# Patient Record
Sex: Female | Born: 1965 | Race: White | Hispanic: No | Marital: Married | Smoking: Former smoker
Health system: Southern US, Academic
[De-identification: ages and names within clinical notes are randomized; demographics above are authoritative.]

## PROBLEM LIST (undated history)

## (undated) DIAGNOSIS — C801 Malignant (primary) neoplasm, unspecified: Secondary | ICD-10-CM

## (undated) DIAGNOSIS — E079 Disorder of thyroid, unspecified: Secondary | ICD-10-CM

## (undated) DIAGNOSIS — M129 Arthropathy, unspecified: Secondary | ICD-10-CM

## (undated) DIAGNOSIS — I10 Essential (primary) hypertension: Secondary | ICD-10-CM

## (undated) DIAGNOSIS — I87309 Chronic venous hypertension (idiopathic) without complications of unspecified lower extremity: Secondary | ICD-10-CM

## (undated) DIAGNOSIS — I341 Nonrheumatic mitral (valve) prolapse: Secondary | ICD-10-CM

## (undated) DIAGNOSIS — E162 Hypoglycemia, unspecified: Secondary | ICD-10-CM

## (undated) DIAGNOSIS — I472 Ventricular tachycardia: Secondary | ICD-10-CM

## (undated) DIAGNOSIS — M810 Age-related osteoporosis without current pathological fracture: Secondary | ICD-10-CM

## (undated) DIAGNOSIS — I428 Other cardiomyopathies: Secondary | ICD-10-CM

## (undated) DIAGNOSIS — Q210 Ventricular septal defect: Secondary | ICD-10-CM

## (undated) DIAGNOSIS — Z87442 Personal history of urinary calculi: Secondary | ICD-10-CM

## (undated) DIAGNOSIS — I493 Ventricular premature depolarization: Secondary | ICD-10-CM

## (undated) DIAGNOSIS — I471 Supraventricular tachycardia, unspecified: Secondary | ICD-10-CM

## (undated) DIAGNOSIS — I82409 Acute embolism and thrombosis of unspecified deep veins of unspecified lower extremity: Secondary | ICD-10-CM

## (undated) DIAGNOSIS — I4719 Other supraventricular tachycardia: Secondary | ICD-10-CM

## (undated) DIAGNOSIS — R29898 Other symptoms and signs involving the musculoskeletal system: Secondary | ICD-10-CM

## (undated) DIAGNOSIS — R000 Tachycardia, unspecified: Secondary | ICD-10-CM

## (undated) DIAGNOSIS — N83209 Unspecified ovarian cyst, unspecified side: Secondary | ICD-10-CM

## (undated) HISTORY — DX: Nonrheumatic mitral (valve) prolapse: I34.1

## (undated) HISTORY — DX: Other symptoms and signs involving the musculoskeletal system: R29.898

## (undated) HISTORY — DX: Other supraventricular tachycardia: I47.19

## (undated) HISTORY — DX: Other cardiomyopathies: I42.8

## (undated) HISTORY — DX: Chronic venous hypertension (idiopathic) without complications of unspecified lower extremity: I87.309

## (undated) HISTORY — PX: HAMMER TOE SURGERY: SHX385

## (undated) HISTORY — PX: COLONOSCOPY: SHX174

## (undated) HISTORY — DX: Unspecified ovarian cyst, unspecified side: N83.209

## (undated) HISTORY — PX: ENDOVENOUS ABLATION SAPHENOUS VEIN W/ LASER: SUR449

## (undated) HISTORY — DX: Supraventricular tachycardia: I47.1

## (undated) HISTORY — DX: Supraventricular tachycardia, unspecified: I47.10

## (undated) HISTORY — DX: Ventricular premature depolarization: I49.3

## (undated) HISTORY — DX: Ventricular septal defect: Q21.0

## (undated) HISTORY — PX: OVARIAN CYST REMOVAL: SHX89

## (undated) HISTORY — DX: Ventricular tachycardia: I47.2

## (undated) HISTORY — PX: BUNIONECTOMY: SHX129

## (undated) HISTORY — PX: WRIST FRACTURE SURGERY: SHX121

## (undated) HISTORY — DX: Tachycardia, unspecified: R00.0

## (undated) HISTORY — PX: HX BREAST BIOPSY: SHX20

## (undated) HISTORY — DX: Malignant (primary) neoplasm, unspecified (CMS HCC): C80.1

---

## 1990-07-11 ENCOUNTER — Ambulatory Visit (HOSPITAL_COMMUNITY): Payer: Self-pay

## 1998-07-31 ENCOUNTER — Other Ambulatory Visit: Admission: RE | Admit: 1998-07-31 | Discharge: 1998-07-31 | Payer: Self-pay | Admitting: Obstetrics and Gynecology

## 1999-09-10 ENCOUNTER — Other Ambulatory Visit: Admission: RE | Admit: 1999-09-10 | Discharge: 1999-09-10 | Payer: Self-pay | Admitting: Obstetrics and Gynecology

## 2000-10-06 ENCOUNTER — Other Ambulatory Visit: Admission: RE | Admit: 2000-10-06 | Discharge: 2000-10-06 | Payer: Self-pay | Admitting: Obstetrics and Gynecology

## 2001-11-15 ENCOUNTER — Other Ambulatory Visit: Admission: RE | Admit: 2001-11-15 | Discharge: 2001-11-15 | Payer: Self-pay | Admitting: Obstetrics and Gynecology

## 2001-12-04 ENCOUNTER — Ambulatory Visit (HOSPITAL_COMMUNITY): Admission: RE | Admit: 2001-12-04 | Discharge: 2001-12-04 | Payer: Self-pay | Admitting: Obstetrics and Gynecology

## 2002-11-19 ENCOUNTER — Other Ambulatory Visit: Admission: RE | Admit: 2002-11-19 | Discharge: 2002-11-19 | Payer: Self-pay | Admitting: Obstetrics and Gynecology

## 2003-11-27 ENCOUNTER — Other Ambulatory Visit: Admission: RE | Admit: 2003-11-27 | Discharge: 2003-11-27 | Payer: Self-pay | Admitting: Obstetrics and Gynecology

## 2004-11-27 ENCOUNTER — Other Ambulatory Visit: Admission: RE | Admit: 2004-11-27 | Discharge: 2004-11-27 | Payer: Self-pay | Admitting: Obstetrics and Gynecology

## 2006-11-03 ENCOUNTER — Ambulatory Visit (HOSPITAL_COMMUNITY): Admission: RE | Admit: 2006-11-03 | Discharge: 2006-11-03 | Payer: Self-pay | Admitting: Surgery

## 2011-01-13 ENCOUNTER — Other Ambulatory Visit: Payer: Self-pay | Admitting: Obstetrics and Gynecology

## 2012-03-21 ENCOUNTER — Other Ambulatory Visit: Payer: Self-pay | Admitting: Obstetrics and Gynecology

## 2013-06-24 ENCOUNTER — Emergency Department (HOSPITAL_COMMUNITY)
Admission: EM | Admit: 2013-06-24 | Discharge: 2013-06-24 | Disposition: A | Discharge status: Home / Self Care | Payer: BC Managed Care – PPO | Attending: Emergency Medicine | Admitting: Emergency Medicine

## 2013-06-24 ENCOUNTER — Encounter (HOSPITAL_COMMUNITY): Payer: Self-pay | Admitting: Emergency Medicine

## 2013-06-24 ENCOUNTER — Emergency Department (HOSPITAL_COMMUNITY): Payer: BC Managed Care – PPO

## 2013-06-24 DIAGNOSIS — M549 Dorsalgia, unspecified: Secondary | ICD-10-CM | POA: Insufficient documentation

## 2013-06-24 DIAGNOSIS — IMO0001 Reserved for inherently not codable concepts without codable children: Secondary | ICD-10-CM | POA: Insufficient documentation

## 2013-06-24 DIAGNOSIS — M7918 Myalgia, other site: Secondary | ICD-10-CM

## 2013-06-24 DIAGNOSIS — R5381 Other malaise: Secondary | ICD-10-CM | POA: Insufficient documentation

## 2013-06-24 DIAGNOSIS — R05 Cough: Secondary | ICD-10-CM

## 2013-06-24 DIAGNOSIS — Z888 Allergy status to other drugs, medicaments and biological substances status: Secondary | ICD-10-CM | POA: Insufficient documentation

## 2013-06-24 DIAGNOSIS — J069 Acute upper respiratory infection, unspecified: Secondary | ICD-10-CM | POA: Insufficient documentation

## 2013-06-24 HISTORY — DX: Hypoglycemia, unspecified: E16.2

## 2013-06-24 MED ORDER — NAPROXEN 250 MG PO TABS: 250.0000 mg | ORAL_TABLET | Freq: Two times a day (BID) | ORAL | Status: DC

## 2013-06-24 MED ORDER — IBUPROFEN 400 MG PO TABS
400.0000 mg | ORAL_TABLET | Freq: Once | ORAL | Status: AC
  Administered 2013-06-24: 400 mg via ORAL
  Filled 2013-06-24: qty 1

## 2013-06-24 MED ORDER — ALBUTEROL SULFATE HFA 108 (90 BASE) MCG/ACT IN AERS
2.0000 | INHALATION_SPRAY | RESPIRATORY_TRACT | Status: AC
  Administered 2013-06-24: 2 via RESPIRATORY_TRACT
  Filled 2013-06-24: qty 6.7

## 2013-06-24 MED ORDER — BENZONATATE 100 MG PO CAPS: 100.0000 mg | ORAL_CAPSULE | Freq: Three times a day (TID) | ORAL | Status: DC | PRN

## 2013-06-24 MED ORDER — HYDROCODONE-ACETAMINOPHEN 5-325 MG PO TABS
1.0000 | ORAL_TABLET | Freq: Once | ORAL | Status: AC
  Administered 2013-06-24: 1 via ORAL
  Filled 2013-06-24: qty 1

## 2013-06-24 MED ORDER — HYDROCODONE-ACETAMINOPHEN 5-325 MG PO TABS: ORAL_TABLET | ORAL | Status: DC

## 2013-06-24 NOTE — ED Notes (Signed)
Family at bedside. 

## 2013-06-24 NOTE — ED Notes (Signed)
Patient advised her husband has been diagnosed with pneumonia and she started having URI symptoms two weeks ago.  The patient said her PCP prescribed antibiotics for 10 days and she has gotten worse instead of better.  Today, she presents with a productive cough, pain between her shoulder blades, and weak.

## 2013-06-24 NOTE — ED Notes (Signed)
Pt c/o URI sx x 2 weeks with cough and congestion; pt sts increased pain in back with cough

## 2013-06-24 NOTE — ED Provider Notes (Signed)
CSN: [id number]     Arrival date & time 06/24/13  1047 History   First MD Initiated Contact with Patient 06/24/13 1117     Chief Complaint  Patient presents with  . URI  . Cough    HPI Pt was seen at 1130.   Per pt, c/o gradual onset and persistence of constant runny/stuffy nose, sinus congestion, and cough for the past 10 days. States she initially had a sore throat, since resolved. Pt states she was evaluated by her PMD for same, rx abx and prednisone but feels she is "not getting any better." States she feels generally fatigued and has mid-back "aching" pain. States her back pain began after "coughing so much." Pain worsens with palpation of the area, coughing, and body position changes. Denies fevers, no rash, no CP/SOB, no N/V/D, no abd pain.     Past Medical History  Diagnosis Date  . Hypoglycemia    History reviewed. No pertinent past surgical history.  History  Substance Use Topics  . Smoking status: Never Smoker   . Smokeless tobacco: Not on file  . Alcohol Use: Yes     Comment: occ    Review of Systems ROS: Statement: All systems negative except as marked or noted in the HPI; Constitutional: Negative for fever and chills. +generalized body aches/fatigue. ; ; Eyes: Negative for eye pain, redness and discharge. ; ; ENMT: Negative for ear pain, hoarseness, sore throat. +nasal congestion, rhinorrhea, sinus pressure. ; ; Cardiovascular: Negative for chest pain, palpitations, diaphoresis, dyspnea and peripheral edema. ; ; Respiratory: +cough. Negative for wheezing and stridor. ; ; Gastrointestinal: Negative for nausea, vomiting, diarrhea, abdominal pain, blood in stool, hematemesis, jaundice and rectal bleeding. . ; ; Genitourinary: Negative for dysuria, flank pain and hematuria. ; ; Musculoskeletal: +back pain. Negative for neck pain. Negative for swelling and trauma.; ; Skin: Negative for pruritus, rash, abrasions, blisters, bruising and skin lesion.; ; Neuro: Negative for  headache, lightheadedness and neck stiffness. Negative for weakness, altered level of consciousness , altered mental status, extremity weakness, paresthesias, involuntary movement, seizure and syncope.      Allergies  Hydrocortisone  Home Medications  No current outpatient prescriptions on file. BP 129/58  Pulse 79  Temp(Src) 98.1 F (36.7 C) (Oral)  Resp 17  Wt 141 lb (63.957 kg)  SpO2 100% Physical Exam 1135: Physical examination:  Nursing notes reviewed; Vital signs and O2 SAT reviewed;  Constitutional: Well developed, Well nourished, Well hydrated, In no acute distress; Head:  Normocephalic, atraumatic; Eyes: EOMI, PERRL, No scleral icterus; ENMT: TM's clear bilat. +edemetous nasal turbinates bilat with clear rhinorrhea. Mouth and pharynx without lesions. No tonsillar exudates. No intra-oral edema. No submandibular or sublingual edema. No hoarse voice, no drooling, no stridor. No pain with manipulation of larynx.  Mouth and pharynx normal, Mucous membranes moist; Neck: Supple, Full range of motion, No lymphadenopathy; Cardiovascular: Regular rate and rhythm, No gallop; Respiratory: Breath sounds diminished & equal bilaterally, No wheezes.  Speaking full sentences with ease, Normal respiratory effort/excursion; Chest: Nontender, Movement normal; Abdomen: Soft, Nontender, Nondistended, Normal bowel sounds; Genitourinary: No CVA tenderness; Spine:  No midline CS, TS, LS tenderness. +TTP bilat hypertonic thoracic paraspinal muscles.; Extremities: Pulses normal, No tenderness, No edema, No calf edema or asymmetry.; Neuro: AA&Ox3, Major CN grossly intact.  Speech clear. No gross focal motor or sensory deficits in extremities.; Skin: Color normal, Warm, Dry.   ED Course  Procedures   EKG Interpretation   None  MDM  MDM Reviewed: previous chart, nursing note and vitals Interpretation: x-ray     Dg Chest 2 View 06/24/2013   CLINICAL DATA:  Cold and congestion symptoms for 2  weeks.  EXAM: CHEST  2 VIEW  COMPARISON:  None.  FINDINGS: The heart size and mediastinal contours are within normal limits. Both lungs are clear. The visualized skeletal structures are unremarkable.  IMPRESSION: No active cardiopulmonary disease.   Electronically Signed   By: Amie Portland M.D.   On: 06/24/2013 12:33    1300:  Feels better after meds and MDI. Pt not orthostatic, VS remain stable, resps easy. No infiltrate or mediastinal widening on CXR; doubt TAA as cause for back pain. Doubt PE with low risk Wells. Doubt ACS without chest pain, TIMI 0. Pt states she wants to go home now. Will tx symptomatically at this time. Dx and testing d/w pt and family.  Questions answered.  Verb understanding, agreeable to d/c home with outpt f/u.     Laray Anger, DO 06/27/13 1451

## 2014-04-02 ENCOUNTER — Other Ambulatory Visit: Payer: Self-pay | Admitting: Obstetrics and Gynecology

## 2014-04-03 LAB — CYTOLOGY - PAP

## 2015-06-03 ENCOUNTER — Other Ambulatory Visit: Payer: Self-pay | Admitting: Obstetrics and Gynecology

## 2015-06-04 LAB — CYTOLOGY - PAP

## 2016-06-08 ENCOUNTER — Other Ambulatory Visit: Payer: Self-pay | Admitting: Obstetrics and Gynecology

## 2016-06-09 LAB — CYTOLOGY - PAP

## 2017-09-27 ENCOUNTER — Emergency Department (HOSPITAL_COMMUNITY)
Admission: EM | Admit: 2017-09-27 | Discharge: 2017-09-27 | Disposition: A | Discharge status: Home / Self Care | Payer: BLUE CROSS/BLUE SHIELD | Attending: Emergency Medicine | Admitting: Emergency Medicine

## 2017-09-27 ENCOUNTER — Encounter (HOSPITAL_COMMUNITY): Payer: Self-pay

## 2017-09-27 DIAGNOSIS — M79605 Pain in left leg: Secondary | ICD-10-CM | POA: Insufficient documentation

## 2017-09-27 DIAGNOSIS — Z5321 Procedure and treatment not carried out due to patient leaving prior to being seen by health care provider: Secondary | ICD-10-CM | POA: Diagnosis not present

## 2017-09-27 HISTORY — DX: Disorder of thyroid, unspecified: E07.9

## 2017-09-27 HISTORY — DX: Acute embolism and thrombosis of unspecified deep veins of unspecified lower extremity: I82.409

## 2017-09-27 LAB — BASIC METABOLIC PANEL
Anion gap: 7 (ref 5–15)
BUN: 20 mg/dL (ref 6–20)
CO2: 27 mmol/L (ref 22–32)
CREATININE: 0.77 mg/dL (ref 0.44–1.00)
Calcium: 12.5 mg/dL — ABNORMAL HIGH (ref 8.9–10.3)
Chloride: 106 mmol/L (ref 101–111)
GFR calc Af Amer: >60 mL/min (ref >60)
GLUCOSE: 93 mg/dL (ref 65–99)
POTASSIUM: 4.6 mmol/L (ref 3.5–5.1)
SODIUM: 140 mmol/L (ref 135–145)

## 2017-09-27 LAB — CBC
HEMATOCRIT: 40.1 % (ref 36.0–46.0)
Hemoglobin: 12.5 g/dL (ref 12.0–15.0)
MCH: 29.7 pg (ref 26.0–34.0)
MCHC: 31.2 g/dL (ref 30.0–36.0)
MCV: 95.2 fL (ref 78.0–100.0)
Platelets: 220 10*3/uL (ref 150–400)
RBC: 4.21 MIL/uL (ref 3.87–5.11)
RDW: 13.1 % (ref 11.5–15.5)
WBC: 5.5 10*3/uL (ref 4.0–10.5)

## 2017-09-27 NOTE — ED Triage Notes (Addendum)
Pt presents with onset of pain and swelling to L leg that she noted while at grocery store.  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  Pt denies any shortness of breath. Pt denies any chest pain but reports while bringing in groceries, she was very fatigued and had palpitations.

## 2017-09-27 NOTE — ED Provider Notes (Cosign Needed)
Patient placed in Quick Look pathway, seen and evaluated   Chief Complaint: left leg pain  HPI:   Jessica Hanson is a 52 y.o. female who presents to the ED with left leg pain that started while she was in the grocery store tonight. Hx of DVT and was on blood thinners for 3 months but has been off but has been off of them for 4 months. Patient denies shortness of breath or chest pain.   ROS: M/S: left leg pain  Physical Exam:   Gen: No distress  Neuro: Awake and Alert  Skin: Warm and dry  M/S: tender with palpation of the left calf and and posterior aspect of the left knee.     Focused Exam:    Initiation of care has begun. The patient has been counseled on the process, plan, and necessity for staying for the completion/evaluation, and the remainder of the medical screening examination    Ashley Murrain, NP 09/27/17 1845

## 2018-01-06 DIAGNOSIS — Z1211 Encounter for screening for malignant neoplasm of colon: Secondary | ICD-10-CM | POA: Diagnosis not present

## 2018-01-06 DIAGNOSIS — Z86010 Personal history of colonic polyps: Secondary | ICD-10-CM | POA: Diagnosis not present

## 2018-01-24 DIAGNOSIS — J209 Acute bronchitis, unspecified: Secondary | ICD-10-CM | POA: Diagnosis not present

## 2018-03-08 DIAGNOSIS — Z01419 Encounter for gynecological examination (general) (routine) without abnormal findings: Secondary | ICD-10-CM | POA: Diagnosis not present

## 2018-03-08 DIAGNOSIS — Z6823 Body mass index (BMI) 23.0-23.9, adult: Secondary | ICD-10-CM | POA: Diagnosis not present

## 2018-03-08 DIAGNOSIS — Z124 Encounter for screening for malignant neoplasm of cervix: Secondary | ICD-10-CM | POA: Diagnosis not present

## 2018-03-08 DIAGNOSIS — Z1231 Encounter for screening mammogram for malignant neoplasm of breast: Secondary | ICD-10-CM | POA: Diagnosis not present

## 2018-05-26 DIAGNOSIS — Z23 Encounter for immunization: Secondary | ICD-10-CM | POA: Diagnosis not present

## 2018-07-10 ENCOUNTER — Inpatient Hospital Stay (HOSPITAL_COMMUNITY)
Admission: EM | Admit: 2018-07-10 | Discharge: 2018-07-10 | Disposition: A | Discharge status: Home / Self Care | Payer: 59 | Source: Other Acute Inpatient Hospital

## 2018-07-10 DIAGNOSIS — R0781 Pleurodynia: Secondary | ICD-10-CM

## 2018-08-24 DIAGNOSIS — B9789 Other viral agents as the cause of diseases classified elsewhere: Secondary | ICD-10-CM | POA: Diagnosis not present

## 2018-08-24 DIAGNOSIS — J988 Other specified respiratory disorders: Secondary | ICD-10-CM | POA: Diagnosis not present

## 2018-08-31 DIAGNOSIS — Z124 Encounter for screening for malignant neoplasm of cervix: Secondary | ICD-10-CM | POA: Diagnosis not present

## 2018-08-31 DIAGNOSIS — Z1211 Encounter for screening for malignant neoplasm of colon: Secondary | ICD-10-CM | POA: Diagnosis not present

## 2018-08-31 DIAGNOSIS — Z131 Encounter for screening for diabetes mellitus: Secondary | ICD-10-CM | POA: Diagnosis not present

## 2018-08-31 DIAGNOSIS — E785 Hyperlipidemia, unspecified: Secondary | ICD-10-CM | POA: Diagnosis not present

## 2018-08-31 DIAGNOSIS — Z1239 Encounter for other screening for malignant neoplasm of breast: Secondary | ICD-10-CM | POA: Diagnosis not present

## 2018-08-31 DIAGNOSIS — Z0000 Encounter for general adult medical examination without abnormal findings: Secondary | ICD-10-CM | POA: Diagnosis not present

## 2018-10-26 DIAGNOSIS — L578 Other skin changes due to chronic exposure to nonionizing radiation: Secondary | ICD-10-CM | POA: Diagnosis not present

## 2018-10-26 DIAGNOSIS — D225 Melanocytic nevi of trunk: Secondary | ICD-10-CM | POA: Diagnosis not present

## 2018-12-06 DIAGNOSIS — N950 Postmenopausal bleeding: Secondary | ICD-10-CM | POA: Diagnosis not present

## 2018-12-06 DIAGNOSIS — Z6823 Body mass index (BMI) 23.0-23.9, adult: Secondary | ICD-10-CM | POA: Diagnosis not present

## 2018-12-06 DIAGNOSIS — M79605 Pain in left leg: Secondary | ICD-10-CM | POA: Diagnosis not present

## 2019-03-09 DIAGNOSIS — H43811 Vitreous degeneration, right eye: Secondary | ICD-10-CM | POA: Diagnosis not present

## 2019-04-02 DIAGNOSIS — Z23 Encounter for immunization: Secondary | ICD-10-CM | POA: Diagnosis not present

## 2019-04-06 DIAGNOSIS — H43811 Vitreous degeneration, right eye: Secondary | ICD-10-CM | POA: Diagnosis not present

## 2019-04-06 DIAGNOSIS — H40013 Open angle with borderline findings, low risk, bilateral: Secondary | ICD-10-CM | POA: Diagnosis not present

## 2019-05-02 DIAGNOSIS — Z124 Encounter for screening for malignant neoplasm of cervix: Secondary | ICD-10-CM | POA: Diagnosis not present

## 2019-05-02 DIAGNOSIS — Z1231 Encounter for screening mammogram for malignant neoplasm of breast: Secondary | ICD-10-CM | POA: Diagnosis not present

## 2019-05-02 DIAGNOSIS — Z6822 Body mass index (BMI) 22.0-22.9, adult: Secondary | ICD-10-CM | POA: Diagnosis not present

## 2019-05-02 DIAGNOSIS — Z01419 Encounter for gynecological examination (general) (routine) without abnormal findings: Secondary | ICD-10-CM | POA: Diagnosis not present

## 2019-06-11 NOTE — Progress Notes (Signed)
[facility]    Cardiology Consult  Note    Date:  06/12/2019   ID:  Jessica Hanson, DOB Nov 12, 1965, MRN [id number]  PCP:  Jessica Greenhouse, MD  Cardiologist:  Jessica Him, MD   Chief Complaint  Patient presents with  . New Patient (Initial Visit)    Heart murmur, exertional fatigue and palpitations    History of Present Illness:  Jessica Hanson is a 53 y.o. female who is being seen today for the evaluation of palpitations and heart murmur at the request of Jessica Munro, MD  This is a 53yo female with a hx of DVT on Eliquis who recently saw her GYn and complained of problems with palpitations as well as exertional fatigue.  She is very active and works out daily.  She has swam for years and recently noticed that she has not been able to exercise as much due to getting very exhausted with exertion.  She notices this with swimming, walking to dog or exercises in her gym at home.  She denies any anginal CP or DOE.  She has also noticed that when she lays down at night that she can feel a pounding sensation in her chest.  This mainly occurs in bed but has also noticed it after walking she goes to rest.  She denies any Nausea, diaphoresis or SOB with the palpitations or when she exerts herself.  She has a very strong fm hx of premature CAD.  She stopped exercising about 2 months ago because she was concerned about her sx.     Past Medical History:  Diagnosis Date  . DVT (deep venous thrombosis) ([place])   . Hypoglycemia   . Ovarian cyst   . Thyroid disease   . Venous hypertension   . Weakness of lower extremity     Past Surgical History:  Procedure Laterality Date  . ENDOVENOUS ABLATION SAPHENOUS VEIN W/ LASER      Current Medications: Current Meds  Medication Sig  . Ascorbic Acid (VITAMIN C) 500 MG CHEW Chew 500 mg by mouth 2 (two) times daily.  Marland Kitchen aspirin EC 81 MG tablet Take 81 mg by mouth daily.  . Cholecalciferol (VITAMIN D3) 125 MCG (5000 UT) CAPS Take by mouth as directed.  .  COLLAGEN PO Take by mouth. 4-6 tablets  . diphenhydrAMINE (BENADRYL) 25 MG tablet Take 25 mg by mouth every 6 (six) hours as needed for allergies.  . Melatonin 5 MG TABS Take by mouth at bedtime.  . Omega-3 Fatty Acids (FISH OIL TRIPLE STRENGTH) 1400 MG CAPS Take by mouth as directed.  Marland Kitchen OVER THE COUNTER MEDICATION GOTU COLA 950mg  immune support  . OVER THE COUNTER MEDICATION Prevogen improve memory  . OVER THE COUNTER MEDICATION Vein formula support health veins  . pyridOXINE (VITAMIN B-6) 100 MG tablet Take 100 mg by mouth daily.  . Turmeric 450 MG CAPS Take by mouth as directed.  . vitamin B-12 (CYANOCOBALAMIN) 1000 MCG tablet Take 1,000 mcg by mouth daily.  Marland Kitchen zinc gluconate 50 MG tablet Take 50 mg by mouth daily.    Allergies:   Hydrocortisone, Oxycodone, and Sulfa antibiotics   Social History   Socioeconomic History  . Marital status: Married    Spouse name: Not on file  . Number of children: Not on file  . Years of education: Not on file  . Highest education level: Not on file  Occupational History  . Not on file  Tobacco Use  . Smoking status: Never Smoker  .  Smokeless tobacco: Never Used  Substance and Sexual Activity  . Alcohol use: Yes    Comment: occ  . Drug use: No  . Sexual activity: Not on file  Other Topics Concern  . Not on file  Social History Narrative  . Not on file   Social Determinants of Health   Financial Resource Strain:   . Difficulty of Paying Living Expenses: Not on file  Food Insecurity:   . Worried About Charity fundraiser in the Last Year: Not on file  . Ran Out of Food in the Last Year: Not on file  Transportation Needs:   . Lack of Transportation (Medical): Not on file  . Lack of Transportation (Non-Medical): Not on file  Physical Activity:   . Days of Exercise per Week: Not on file  . Minutes of Exercise per Session: Not on file  Stress:   . Feeling of Stress : Not on file  Social Connections:   . Frequency of Communication with  Friends and Family: Not on file  . Frequency of Social Gatherings with Friends and Family: Not on file  . Attends Religious Services: Not on file  . Active Member of Clubs or Organizations: Not on file  . Attends Archivist Meetings: Not on file  . Marital Status: Not on file     Family History:  The patient's family history includes Varicose Veins in her maternal grandmother and sister.   ROS:   Please see the history of present illness.    ROS All other systems reviewed and are negative.  No flowsheet data found.     PHYSICAL EXAM:   VS:  BP 132/84   Pulse 76   Ht 5\' 6"  (1.676 m)   Wt 141 lb 3.2 oz (64 kg)   BMI 22.79 kg/m    GEN: Well nourished, well developed, in no acute distress  HEENT: normal  Neck: no JVD, carotid bruits, or masses Cardiac: RRR; no rubs, or gallops,no edema.  Intact distal pulses bilaterally. 2/6 SM at LUSB, LLSB and apex, ? Mid systolic click Respiratory:  clear to auscultation bilaterally, normal work of breathing GI: soft, nontender, nondistended, + BS MS: no deformity or atrophy  Skin: warm and dry, no rash Neuro:  Alert and Oriented x 3, Strength and sensation are intact Psych: euthymic mood, full affect  Wt Readings from Last 3 Encounters:  06/12/19 141 lb 3.2 oz (64 kg)  09/27/17 140 lb (63.5 kg)  06/24/13 141 lb (64 kg)      Studies/Labs Reviewed:   EKG:  EKG is ordered today.  The ekg ordered today demonstrates NSR with no ST changes  Recent Labs: No results found for requested labs within last 8760 hours.   Lipid Panel No results found for: CHOL, TRIG, HDL, CHOLHDL, VLDL, LDLCALC, LDLDIRECT  Additional studies/ records that were reviewed today include:  Office notes from OB/GYN    ASSESSMENT:    1. Fatigue due to excessive exertion, initial encounter   2. Heart murmur   3. Palpitations      PLAN:  In order of problems listed above:  1. Exertional fatigue -she is an avid swimmer and exercises in her gym  at home but recently has noticed that she has much less stamina and gets very fatigued quickly after trying to exercise -she has a fm hx of premature CAD -I have recommended a coronary CTA to assess coronary anatomy  2.  Heart murmur -she has a systolic murmur at  the LUSB and LLSB that varies with respiration and could represent PR or TR -she also has a systolic murmur at the apex with ? Mid systolic click that could represent MVP with MR -I will get a 2D echo to assess  3.  Palpitations -these occur more at night but she also notices them after exertion -they occur on a daily basis -I will get a 2 week Zio Patch to rule out PAF -she has significantly cut back on her caffeine intake and chocolate    Medication Adjustments/Labs and Tests Ordered: Current medicines are reviewed at length with the patient today.  Concerns regarding medicines are outlined above.  Medication changes, Labs and Tests ordered today are listed in the Patient Instructions below.  Patient Instructions  Medication Instructions:  Your physician recommends that you continue on your current medications as directed. Please refer to the Current Medication list given to you today.  *If you need a refill on your cardiac medications before your next appointment, please call your pharmacy*  Testing/Procedures: Your physician has requested that you have an echocardiogram. Echocardiography is a painless test that uses sound waves to create images of your heart. It provides your doctor with information about the size and shape of your heart and how well your heart's chambers and valves are working. This procedure takes approximately one hour. There are no restrictions for this procedure.  Cardiac CT Angiography (CTA), is a special type of CT scan that uses a computer to produce multi-dimensional views of major blood vessels throughout the body. In CT angiography, a contrast material is injected through an IV to help visualize  the blood vessels.   Follow-Up: At [facility], you and your health needs are our priority.  As part of our continuing mission to provide you with exceptional heart care, we have created designated Provider Care Teams.  These Care Teams include your primary Cardiologist (physician) and Advanced Practice Providers (APPs -  Physician Assistants and Nurse Practitioners) who all work together to provide you with the care you need, when you need it.  Follow up with Dr. Radford Pax as needed based on test results.   Other Instructions  Your cardiac CT will be scheduled at one of the below locations:   [facility] [address] [place], [place] [address] [phone]  [place] [address] [place], [place] [address] [phone]  If scheduled at [facility], please arrive at the [facility] main entrance of [facility] 30-45 minutes prior to test start time. Proceed to the [facility] Radiology Department (first floor) to check-in and test prep.  If scheduled at [facility], please arrive 15 mins early for check-in and test prep.  Please follow these instructions carefully (unless otherwise directed):  On the Night Before the Test: . Be sure to Drink plenty of water. . Do not consume any caffeinated/decaffeinated beverages or chocolate 12 hours prior to your test. . Do not take any antihistamines 12 hours prior to your test.  On the Day of the Test: . Drink plenty of water. Do not drink any water within one hour of the test. . Do not eat any food 4 hours prior to the test. . You may take your regular medications prior to the test.  . Take metoprolol (Lopressor) two hours prior to test. . FEMALES- please wear underwire-free bra if available      After the Test: . Drink plenty  of water. . After receiving IV contrast, you may experience a mild flushed feeling. This is  normal. . On occasion, you may experience a mild rash up to 24 hours after the test. This is not dangerous. If this occurs, you can take Benadryl 25 mg and increase your fluid intake. . If you experience trouble breathing, this can be serious. If it is severe call 911 IMMEDIATELY. If it is mild, please call our office. . If you take any of these medications: Glipizide/Metformin, Avandament, Glucavance, please do not take 48 hours after completing test unless otherwise instructed.   Once we have confirmed authorization from your insurance company, we will call you to set up a date and time for your test.   For non-scheduling related questions, please contact the cardiac imaging nurse navigator should you have any questions/concerns: Marchia Bond, RN Navigator Cardiac Imaging [facility] and Vascular Services [phone] Office       Signed, Jessica Him, MD  06/12/2019 9:32 AM    [place], [place], [place]  [address] Phone: [phone]; Fax: [phone]

## 2019-06-12 ENCOUNTER — Encounter: Payer: Self-pay | Admitting: Cardiology

## 2019-06-12 ENCOUNTER — Other Ambulatory Visit: Payer: Self-pay

## 2019-06-12 ENCOUNTER — Telehealth: Payer: Self-pay | Admitting: Radiology

## 2019-06-12 ENCOUNTER — Ambulatory Visit (INDEPENDENT_AMBULATORY_CARE_PROVIDER_SITE_OTHER): Payer: BC Managed Care – PPO | Admitting: Cardiology

## 2019-06-12 VITALS — BP 132/84 | HR 76 | Ht 66.0 in | Wt 141.2 lb

## 2019-06-12 DIAGNOSIS — T733XXA Exhaustion due to excessive exertion, initial encounter: Secondary | ICD-10-CM | POA: Diagnosis not present

## 2019-06-12 DIAGNOSIS — R002 Palpitations: Secondary | ICD-10-CM | POA: Diagnosis not present

## 2019-06-12 DIAGNOSIS — I82403 Acute embolism and thrombosis of unspecified deep veins of lower extremity, bilateral: Secondary | ICD-10-CM

## 2019-06-12 DIAGNOSIS — R011 Cardiac murmur, unspecified: Secondary | ICD-10-CM

## 2019-06-12 MED ORDER — METOPROLOL TARTRATE 100 MG PO TABS: 100.0000 mg | ORAL_TABLET | Freq: Once | ORAL | 0 refills | Status: DC

## 2019-06-12 NOTE — Telephone Encounter (Signed)
Enrolled patient for a 14 day Zio monitor to be mailed to patients home.  

## 2019-06-12 NOTE — Patient Instructions (Addendum)
Medication Instructions:  Your physician recommends that you continue on your current medications as directed. Please refer to the Current Medication list given to you today.  *If you need a refill on your cardiac medications before your next appointment, please call your pharmacy*  Testing/Procedures: Your physician has requested that you have an echocardiogram. Echocardiography is a painless test that uses sound waves to create images of your heart. It provides your doctor with information about the size and shape of your heart and how well your heart's chambers and valves are working. This procedure takes approximately one hour. There are no restrictions for this procedure.  Cardiac CT Angiography (CTA), is a special type of CT scan that uses a computer to produce multi-dimensional views of major blood vessels throughout the body. In CT angiography, a contrast material is injected through an IV to help visualize the blood vessels.   Follow-Up: At [facility], you and your health needs are our priority.  As part of our continuing mission to provide you with exceptional heart care, we have created designated Provider Care Teams.  These Care Teams include your primary Cardiologist (physician) and Advanced Practice Providers (APPs -  Physician Assistants and Nurse Practitioners) who all work together to provide you with the care you need, when you need it.  Follow up with Jessica Hanson as needed based on test results.   Other Instructions  Your cardiac CT will be scheduled at one of the below locations:   [facility] [address] [place], [place] [address] [phone]  [place] [address] [place], [place] [address] [phone]  If scheduled at [facility], please arrive at the [facility] main entrance of [facility] 30-45 minutes prior to test start time. Proceed to the [facility] Radiology  Department (first floor) to check-in and test prep.  If scheduled at [facility], please arrive 15 mins early for check-in and test prep.  Please follow these instructions carefully (unless otherwise directed):  On the Night Before the Test: . Be sure to Drink plenty of water. . Do not consume any caffeinated/decaffeinated beverages or chocolate 12 hours prior to your test. . Do not take any antihistamines 12 hours prior to your test.  On the Day of the Test: . Drink plenty of water. Do not drink any water within one hour of the test. . Do not eat any food 4 hours prior to the test. . You may take your regular medications prior to the test.  . Take metoprolol (Lopressor) two hours prior to test. . FEMALES- please wear underwire-free bra if available      After the Test: . Drink plenty of water. . After receiving IV contrast, you may experience a mild flushed feeling. This is normal. . On occasion, you may experience a mild rash up to 24 hours after the test. This is not dangerous. If this occurs, you can take Benadryl 25 mg and increase your fluid intake. . If you experience trouble breathing, this can be serious. If it is severe call 911 IMMEDIATELY. If it is mild, please call our office. . If you take any of these medications: Glipizide/Metformin, Avandament, Glucavance, please do not take 48 hours after completing test unless otherwise instructed.   Once we have confirmed authorization from your insurance company, we will call you to set up a date and time for your test.   For non-scheduling related questions, please  contact the cardiac imaging nurse navigator should you have any questions/concerns: Jessica Bond, RN Navigator Cardiac Imaging [facility] V[facility] [phone] Office

## 2019-06-12 NOTE — Addendum Note (Signed)
Addended by: Antonieta Iba on: 06/12/2019 11:21 AM   Modules accepted: Orders

## 2019-06-14 ENCOUNTER — Telehealth: Payer: Self-pay | Admitting: Cardiology

## 2019-06-14 NOTE — Telephone Encounter (Signed)
Patient states per her appt with Dr. Radford Pax on 06/12/19 she was advised by the nurse working with Dr. Radford Pax that she did not need to have blood work drawn that day. As she was reading the visit summary today she noticed that lab work had been put in the same day of her visit. She states she was denied blood work because she was told that she did not need to have it done. She lives in [place] and is [place] from the office. She has echo scheduled for next Wednesday 12/23 and wants to know if the blood work needs to be done before the echo or if she can get it done the same day. If not, she would like the lab orders to be sent to the [place] office.

## 2019-06-14 NOTE — Telephone Encounter (Signed)
I spoke to the patient who called inquiring about the lab work to be drawn prior to Cardiac CTA.  She said that she saw it on her discharge summary and was awaiting a call from Dr Theodosia Blender nurse about when it needs to be drawn.    She said that she is coming in on 12/23 for Echo and would like it drawn then, if possible.  I told her that I would have Dr Theodosia Blender nurse follow up with her on 12/18.

## 2019-06-15 NOTE — Telephone Encounter (Signed)
I spoke with the patient and clarified that her lab work needs to be done within 30 days of her CT scan. She would like to have her labs drawn on 12/23 when she is in the office for her echocardiogram. I advised her that if we can get her CT scan scheduled within 30 days after 12/23 that would be fine, if not we will have to have labs drawn at a later date. I also told her that we could get her in for a CT scan sooner in [place] but she would like it done at [facility].

## 2019-06-18 ENCOUNTER — Other Ambulatory Visit: Payer: Self-pay

## 2019-06-18 DIAGNOSIS — I82403 Acute embolism and thrombosis of unspecified deep veins of lower extremity, bilateral: Secondary | ICD-10-CM

## 2019-06-20 ENCOUNTER — Telehealth: Payer: Self-pay | Admitting: Family Medicine

## 2019-06-20 ENCOUNTER — Telehealth: Payer: Self-pay | Admitting: Cardiology

## 2019-06-20 ENCOUNTER — Other Ambulatory Visit: Payer: Self-pay

## 2019-06-20 ENCOUNTER — Ambulatory Visit (HOSPITAL_COMMUNITY): Payer: BC Managed Care – PPO | Attending: Cardiology

## 2019-06-20 DIAGNOSIS — I82403 Acute embolism and thrombosis of unspecified deep veins of lower extremity, bilateral: Secondary | ICD-10-CM

## 2019-06-20 DIAGNOSIS — R002 Palpitations: Secondary | ICD-10-CM | POA: Diagnosis not present

## 2019-06-20 DIAGNOSIS — T733XXA Exhaustion due to excessive exertion, initial encounter: Secondary | ICD-10-CM | POA: Insufficient documentation

## 2019-06-20 DIAGNOSIS — R011 Cardiac murmur, unspecified: Secondary | ICD-10-CM | POA: Insufficient documentation

## 2019-06-20 MED ORDER — PERFLUTREN LIPID MICROSPHERE
1.0000 mL | INTRAVENOUS | Status: AC | PRN
  Administered 2019-06-20: 2 mL via INTRAVENOUS

## 2019-06-20 NOTE — Telephone Encounter (Signed)
New message   Patient has questions about having some blood work (labs) done. Please advise.

## 2019-06-20 NOTE — Telephone Encounter (Signed)
Called patient per workqueue, patient would prefer to schedule with a facility that is closer to her location.

## 2019-06-20 NOTE — Telephone Encounter (Signed)
Spoke with patient and clarified that the blood work that our office wants to have done is a Art gallery manager which is to be done within 30 days of her CT scan. Since the CT scan is not scheduled yet we have not scheduled the blood work. Advised her that she would be getting a call to have both scheduled.  I also clarified that Dr. Radford Pax referred her to hematology. She had received a call to set up an appointment with them to have blood drawn due to her history of DVTs. I clarified that this was different from the blood work that we would like to have done prior to her CT. Patient verbalized understanding.

## 2019-06-21 ENCOUNTER — Other Ambulatory Visit (INDEPENDENT_AMBULATORY_CARE_PROVIDER_SITE_OTHER): Payer: BC Managed Care – PPO

## 2019-06-21 DIAGNOSIS — R011 Cardiac murmur, unspecified: Secondary | ICD-10-CM

## 2019-06-21 DIAGNOSIS — T733XXA Exhaustion due to excessive exertion, initial encounter: Secondary | ICD-10-CM | POA: Diagnosis not present

## 2019-06-21 DIAGNOSIS — R002 Palpitations: Secondary | ICD-10-CM

## 2019-06-25 ENCOUNTER — Telehealth: Payer: Self-pay | Admitting: Cardiology

## 2019-06-25 NOTE — Telephone Encounter (Signed)
Patient is returning phone call regarding Echo results.

## 2019-06-26 ENCOUNTER — Other Ambulatory Visit: Payer: Self-pay

## 2019-06-26 DIAGNOSIS — I82403 Acute embolism and thrombosis of unspecified deep veins of lower extremity, bilateral: Secondary | ICD-10-CM

## 2019-06-27 ENCOUNTER — Other Ambulatory Visit: Payer: Self-pay

## 2019-06-27 ENCOUNTER — Telehealth: Payer: Self-pay

## 2019-06-27 DIAGNOSIS — R002 Palpitations: Secondary | ICD-10-CM

## 2019-06-27 DIAGNOSIS — T733XXA Exhaustion due to excessive exertion, initial encounter: Secondary | ICD-10-CM

## 2019-06-27 NOTE — Telephone Encounter (Signed)
Patient calling to have BMET drawn in [place], I have printed out orders and mailed them to her so she can have that done. Patient is also calling about hematology referral. I informed patient that we have changed the referral to be done in [place] and she will be getting a call to have that scheduled.

## 2019-06-27 NOTE — Telephone Encounter (Signed)
New message    Patient has questions about blood work for the cancer center .  And she also wants to have her BMET for the cardiac ct done in [place], please switch order

## 2019-06-29 DIAGNOSIS — C801 Malignant (primary) neoplasm, unspecified: Secondary | ICD-10-CM

## 2019-06-29 HISTORY — DX: Malignant (primary) neoplasm, unspecified: C80.1

## 2019-07-03 ENCOUNTER — Encounter: Payer: BC Managed Care – PPO | Admitting: Hematology and Oncology

## 2019-07-17 DIAGNOSIS — R002 Palpitations: Secondary | ICD-10-CM | POA: Diagnosis not present

## 2019-07-18 ENCOUNTER — Telehealth: Payer: Self-pay | Admitting: Cardiology

## 2019-07-18 DIAGNOSIS — I82403 Acute embolism and thrombosis of unspecified deep veins of lower extremity, bilateral: Secondary | ICD-10-CM

## 2019-07-18 NOTE — Telephone Encounter (Signed)
Spoke with patient about getting her lab work done in [place] prior to her CT scan. Attempted to assist patient on where she wanted to get it done so I could get it set up for her. Patient states that she will just come into our office on [facility] tomorrow to have labs drawn.   Patient is also waiting to hear about a referral to hematology. She was called in December of last year to have this set up, but states that she would rather see someone in [place]. I advised her that I would look into getting this set up for her.

## 2019-07-18 NOTE — Addendum Note (Signed)
Addended by: Antonieta Iba on: 07/18/2019 05:10 PM   Modules accepted: Orders

## 2019-07-18 NOTE — Telephone Encounter (Signed)
New Message:      Pt says she is supposed to have lab work before her procedure on 07-26-19. It was supposed to be arranged so she can have her lab work in [employer]. She says she have not heard anything., she needs this asap please.

## 2019-07-19 ENCOUNTER — Other Ambulatory Visit: Payer: Self-pay

## 2019-07-19 ENCOUNTER — Other Ambulatory Visit: Payer: BC Managed Care – PPO | Admitting: *Deleted

## 2019-07-19 DIAGNOSIS — T733XXA Exhaustion due to excessive exertion, initial encounter: Secondary | ICD-10-CM | POA: Diagnosis not present

## 2019-07-19 DIAGNOSIS — R011 Cardiac murmur, unspecified: Secondary | ICD-10-CM

## 2019-07-19 DIAGNOSIS — R002 Palpitations: Secondary | ICD-10-CM | POA: Diagnosis not present

## 2019-07-20 ENCOUNTER — Telehealth: Payer: Self-pay

## 2019-07-20 ENCOUNTER — Telehealth: Payer: Self-pay | Admitting: Cardiology

## 2019-07-20 LAB — BASIC METABOLIC PANEL
BUN/Creatinine Ratio: 27 — ABNORMAL HIGH (ref 9–23)
BUN: 21 mg/dL (ref 6–24)
CO2: 24 mmol/L (ref 20–29)
Calcium: 13.2 mg/dL (ref 8.7–10.2)
Chloride: 101 mmol/L (ref 96–106)
Creatinine, Ser: 0.78 mg/dL (ref 0.57–1.00)
GFR calc Af Amer: 99 mL/min/{1.73_m2} (ref >59)
GFR calc non Af Amer: 86 mL/min/{1.73_m2} (ref >59)
Glucose: 100 mg/dL — ABNORMAL HIGH (ref 65–99)
Potassium: 4 mmol/L (ref 3.5–5.2)
Sodium: 141 mmol/L (ref 134–144)

## 2019-07-20 NOTE — Telephone Encounter (Signed)
The patient has been notified of the result and verbalized understanding.  All questions (if any) were answered. Antonieta Iba, RN 07/20/2019 3:51 PM   Spoke with patient's PCP and made them aware of results and faxed them over.

## 2019-07-20 NOTE — Telephone Encounter (Signed)
Jessica Hanson from [facility] with critical results.

## 2019-07-20 NOTE — Telephone Encounter (Signed)
Called pts PCP, Dr. Yvette Rack office and spoke to [place] regarding the patients critical calcium level of 13.2.

## 2019-07-20 NOTE — Telephone Encounter (Signed)
Spoke to [employer] about patient's calcium.  Dr Radford Pax is aware and her nurse is addressing.

## 2019-07-20 NOTE — Telephone Encounter (Signed)
-----   Message from Sueanne Margarita, MD sent at 07/20/2019 10:15 AM EST ----- Please call patient's PCP to let them know she has a critical Calcium level at 13.2 which needs addressing ASAP today. Otherwise labs are fine

## 2019-07-25 ENCOUNTER — Telehealth (HOSPITAL_COMMUNITY): Payer: Self-pay | Admitting: Emergency Medicine

## 2019-07-25 NOTE — Telephone Encounter (Signed)
Reaching out to patient to offer assistance regarding upcoming cardiac imaging study; pt verbalizes understanding of appt date/time, parking situation and where to check in, pre-test NPO status and medications ordered, and verified current allergies; name and call back number provided for further questions should they arise Duglas Heier RN Navigator Cardiac Imaging [facility] [phone] office [phone] cell 

## 2019-07-26 ENCOUNTER — Other Ambulatory Visit: Payer: Self-pay

## 2019-07-26 ENCOUNTER — Ambulatory Visit (HOSPITAL_COMMUNITY)
Admission: RE | Admit: 2019-07-26 | Discharge: 2019-07-26 | Disposition: A | Discharge status: Home / Self Care | Payer: BC Managed Care – PPO | Source: Ambulatory Visit | Attending: Cardiology | Admitting: Cardiology

## 2019-07-26 DIAGNOSIS — Z006 Encounter for examination for normal comparison and control in clinical research program: Secondary | ICD-10-CM

## 2019-07-26 DIAGNOSIS — R011 Cardiac murmur, unspecified: Secondary | ICD-10-CM

## 2019-07-26 DIAGNOSIS — T733XXA Exhaustion due to excessive exertion, initial encounter: Secondary | ICD-10-CM

## 2019-07-26 MED ORDER — NITROGLYCERIN 0.4 MG SL SUBL: 0.8000 mg | SUBLINGUAL_TABLET | Freq: Once | SUBLINGUAL | Status: AC

## 2019-07-26 MED ORDER — NITROGLYCERIN 0.4 MG SL SUBL
SUBLINGUAL_TABLET | SUBLINGUAL | Status: AC
  Administered 2019-07-26: 15:00:00 0.8 mg via SUBLINGUAL
  Filled 2019-07-26: qty 2

## 2019-07-26 MED ORDER — IOHEXOL 350 MG/ML SOLN
100.0000 mL | Freq: Once | INTRAVENOUS | Status: AC | PRN
  Administered 2019-07-26: 100 mL via INTRAVENOUS

## 2019-07-26 NOTE — Research (Signed)
Cadfem Informed Consent    Patient Name: Jessica Hanson   Subject met inclusion and exclusion criteria.  The informed consent form, study requirements and expectations were reviewed with the subject and questions and concerns were addressed prior to the signing of the consent form.  The subject verbalized understanding of the trail requirements.  The subject agreed to participate in the CADFEM trial and signed the informed consent.  The informed consent was obtained prior to performance of any protocol-specific procedures for the subject.  A copy of the signed informed consent was given to the subject and a copy was placed in the subject's medical record.   Neva Seat

## 2019-07-27 ENCOUNTER — Telehealth: Payer: Self-pay

## 2019-07-27 MED ORDER — METOPROLOL SUCCINATE ER 25 MG PO TB24: 25.0000 mg | ORAL_TABLET | Freq: Every day | ORAL | 3 refills | Status: DC

## 2019-07-27 NOTE — Telephone Encounter (Signed)
-----   Message from Sueanne Margarita, MD sent at 07/26/2019  6:35 PM EST ----- Normal coronary CTA

## 2019-07-27 NOTE — Telephone Encounter (Signed)
The patient has been notified of the result and verbalized understanding.  All questions (if any) were answered.  Patient asked about results from heart monitor.  Per Dr. Radford Pax: "Please let patient know that her heart monitor showed frequent extra heart beats from the top and bottom of her heart. Please start Toprol XL 25mg  daily (hold this does on the day of the Coronary CTA as she will have to take a higher dose of metoprolol previously prescribed). Please followup with Extender in 2 weeks in office to see how she is doing"  Patient was notified of results. Rx sent in for Toprol. Appointment made with Melina Copa, PA-C for 02/15.

## 2019-07-30 DIAGNOSIS — Z86711 Personal history of pulmonary embolism: Secondary | ICD-10-CM | POA: Diagnosis not present

## 2019-07-30 DIAGNOSIS — Z7901 Long term (current) use of anticoagulants: Secondary | ICD-10-CM | POA: Diagnosis not present

## 2019-07-30 DIAGNOSIS — I82409 Acute embolism and thrombosis of unspecified deep veins of unspecified lower extremity: Secondary | ICD-10-CM | POA: Diagnosis not present

## 2019-08-10 ENCOUNTER — Encounter: Payer: Self-pay | Admitting: Physician Assistant

## 2019-08-10 NOTE — Progress Notes (Signed)
Cardiology Office Note    Date:  08/13/2019   ID:  Jessica Hanson, DOB 1965-07-02, MRN [id number]  PCP:  Algis Greenhouse, MD  Cardiologist:  Fransico Him, MD  Electrophysiologist:  None   Chief Complaint: f/u cardiac testing, palpitations and fatigue  History of Present Illness:   Jessica Hanson is a 54 y.o. female with history of post-op DVT in 2018 (treated with 3 months of Eliquis), hypoglycemia, venous insufficiency, and recently diagnosed cardiomyopathy who presents for follow-up of testing. She recently saw Dr. Radford Hanson in 05/2019 complaining of palpitaitons and exertional fatigue/dyspnea. 2D echocardiogram 06/20/19 showed EF 40-45% with segmental wall motion abnormalities most prominent in the apical anteroseptal and anterior walls, more mild hypokinesis of these segments toward the base, mildly dilated LV, normal RV, mild MVP, trivial MR, turbulent flow at the border of the tricuspid annulus and RCC - differential includes small perimembranous VSD vs. turbulent right coronary cusp flow, not seen on other planes, only in parasternal short images. Event monitor was obtained showing average HR 72bpm ranging from 41 at 5:12am (SB) to 152bpm, NSR with frequent PVCs, ventricular ventricular couplets and bigeminal PVCs with PVC load 4.8%, WCT possible nonsustained ventricular tachycardia vs. nonsustained atrial tachycardia with aberration, SVT and nonsustained atrial tachycardia. She was started on Toprol. Coronary CTA 07/26/19 showed no CAD, no significant abnormalities. Last labs personally reviewed 07/19/19 showed K 4.0, Cr 0.78, calcium 13.2 (appears elevated in 2019 as well), 2019 Hgb 12.5.  She returns for follow-up today doing well. She feels better on the Toprol and has only had rare breakthrough palpitations. She has been able to exercise regularly in her home gym without any significant dyspnea, angina or palpitations. No history of syncope. Her mother has a  history of atrial fib, CAD and heart failure and is also followed by Dr. Radford Hanson. She stopped her collagen/biotin supplements and vitamin D over 2 weeks ago following the report of her elevated calcium level. She is in the process of seeking out a new PCP as she did not find they were a good match.   Past Medical History:  Diagnosis Date   DVT (deep venous thrombosis) (HCC)    Hypoglycemia    NICM (nonischemic cardiomyopathy) (HCC)    Ovarian cyst    PAT (paroxysmal atrial tachycardia) (HCC)    PVC's (premature ventricular contractions)    SVT (supraventricular tachycardia) (HCC)    Venous hypertension    Weakness of lower extremity    Wide-complex tachycardia ([place])    a. event monitor 06/2019 - possible NSVT vs AT with aberration    Past Surgical History:  Procedure Laterality Date   ENDOVENOUS ABLATION SAPHENOUS VEIN W/ LASER      Current Medications: Current Meds  Medication Sig   Ascorbic Acid (VITAMIN C) 500 MG CHEW Chew 500 mg by mouth 2 (two) times daily.   aspirin EC 81 MG tablet Take 81 mg by mouth daily.   diphenhydrAMINE (BENADRYL) 25 MG tablet Take 25 mg by mouth every 6 (six) hours as needed for allergies.   Melatonin 5 MG TABS Take by mouth at bedtime.   metoprolol succinate (TOPROL XL) 25 MG 24 hr tablet Take 1 tablet (25 mg total) by mouth daily.   Omega-3 Fatty Acids (FISH OIL TRIPLE STRENGTH) 1400 MG CAPS Take by mouth as directed.   OVER THE COUNTER MEDICATION Prevogen improve memory   pyridOXINE (VITAMIN B-6) 100 MG tablet Take 100 mg by mouth daily.   Turmeric  450 MG CAPS Take by mouth as directed.   vitamin B-12 (CYANOCOBALAMIN) 1000 MCG tablet Take 1,000 mcg by mouth daily.   zinc gluconate 50 MG tablet Take 50 mg by mouth daily.   [DISCONTINUED] Cholecalciferol (VITAMIN D3) 125 MCG (5000 UT) CAPS Take by mouth as directed.   [DISCONTINUED] COLLAGEN PO Take by mouth. 4-6 tablets    Allergies:   Hydrocortisone, Oxycodone, and Sulfa  antibiotics   Social History   Socioeconomic History   Marital status: Married    Spouse name: Not on file   Number of children: Not on file   Years of education: Not on file   Highest education level: Not on file  Occupational History   Not on file  Tobacco Use   Smoking status: Never Smoker   Smokeless tobacco: Never Used  Substance and Sexual Activity   Alcohol use: Yes    Comment: occ   Drug use: No   Sexual activity: Not on file  Other Topics Concern   Not on file  Social History Narrative   Not on file   Social Determinants of Health   Financial Resource Strain:    Difficulty of Paying Living Expenses: Not on file  Food Insecurity:    Worried About Saline in the Last Year: Not on file   Ran Out of Food in the Last Year: Not on file  Transportation Needs:    Lack of Transportation (Medical): Not on file   Lack of Transportation (Non-Medical): Not on file  Physical Activity:    Days of Exercise per Week: Not on file   Minutes of Exercise per Session: Not on file  Stress:    Feeling of Stress : Not on file  Social Connections:    Frequency of Communication with Friends and Family: Not on file   Frequency of Social Gatherings with Friends and Family: Not on file   Attends Religious Services: Not on file   Active Member of Clubs or Organizations: Not on file   Attends Archivist Meetings: Not on file   Marital Status: Not on file     Family History:  The patient's family history includes Varicose Veins in her maternal grandmother and sister.  ROS:   Please see the history of present illness.  All other systems are reviewed and otherwise negative.    EKGs/Labs/Other Studies Reviewed:    Studies reviewed are outlined and summarized above. Reports included below if pertinent.  2D echo 05/2019 1. Left ventricular ejection fraction, by visual estimation, is 40 to  45%. The left ventricle has normal function.  There is no left ventricular  hypertrophy.  2. Multiple segmental abnormalities exist. See findings.  3. Mildly dilated left ventricular internal cavity size.  4. The left ventricle demonstrates regional wall motion abnormalities.  5. Global right ventricle has normal systolic function.The right  ventricular size is normal. No increase in right ventricular wall  thickness.  6. Left atrial size was normal.  7. Right atrial size was normal.  8. Mild mitral valve prolapse.  9. The mitral valve is normal in structure. Trivial mitral valve  regurgitation. No evidence of mitral stenosis.  10. The tricuspid valve is normal in structure.  11. The aortic valve is tricuspid. Aortic valve regurgitation is not  visualized. No evidence of aortic valve sclerosis or stenosis.  12. The pulmonic valve was normal in structure. Pulmonic valve  regurgitation is not visualized.  13. The inferior vena cava is normal in  size with greater than 50%  respiratory variability, suggesting right atrial pressure of 3 mmHg.  14. MIld bileaflet mitral valve prolapse.  15. On parasternal short images (img 31, 32) there is turbulent flow seen  at the border of the tricuspid annulus and RCC. Differential includes  small perimembranous VSD vs. turbulent right coronary cusp flow. Not seen  in other imaging planes.  16. There appears to be focal wall motion abnormalities, most prominent in  the apical anteroseptal and anterior walls. More mild hypokinesis of these  segments toward the base.   Coronary CTA 07/26/19 EXAM: Cardiac/Coronary  CT  TECHNIQUE: The patient was scanned on a [employer].  FINDINGS: A 120 kV prospective scan was triggered in the descending thoracic aorta at 111 HU's. Axial non-contrast 3 mm slices were carried out through the heart. The data set was analyzed on a dedicated work station and scored using the [place]. Gantry rotation speed was 250 msecs and collimation  was .6 mm. No beta blockade and 0.8 mg of sl NTG was given. The 3D data set was reconstructed in 5% intervals of the 67-82 % of the R-R cycle. Diastolic phases were analyzed on a dedicated work station using MPR, MIP and VRT modes. The patient received 80 cc of contrast.  Aorta:  Normal size.  No calcifications.  No dissection.  Aortic Valve:  Trileaflet.  No calcifications.  Coronary Arteries:  Normal coronary origin.  Right dominance.  RCA is a large dominant artery that gives rise to PDA and PLVB. There is no plaque.  Left main is a large artery that gives rise to LAD, Ramus and LCX arteries. There is no plaque.  The Ramus is a very small vessel.  There is no plaque.  LAD is a large vessel that gives rise to a moderate sized diagonal. There is no plaque.  LCX is a very small non-dominant artery that gives rise to one large OM1 branch. There is no plaque.  Other findings:  Normal pulmonary vein drainage into the left atrium.  Normal let atrial appendage without a thrombus.  Normal size of the pulmonary artery.  IMPRESSION: 1. Coronary calcium score of 0. This was 0 percentile for age and sex matched control.  2. Normal coronary origin with right dominance.  3. No evidence of CAD.  4. Consider non-atherosclerotic causes of chest pain.  Fransico Him  Event Monitor Report 06/2019  Sinus bradycardia, normal sinus rhythm and sinus tachcyardia. The average heart rate was 72bpm and ranged from 41 to 152bpm.  Frequent PVCs, ventricular couplets and bigeminal PVCs with PVC load 4.8%.  Wide complex tachycardia possible nonsustained ventricular tachcyardia vs. nonsustained atrial tachycardia with aberration.  SVT and nonsustained atrial tachycardia.      EKG:  EKG is not ordered today  Recent Labs: 07/19/2019: BUN 21; Creatinine, Ser 0.78; Potassium 4.0; Sodium 141  Recent Lipid Panel No results found for: CHOL, TRIG, HDL, CHOLHDL, VLDL, LDLCALC,  LDLDIRECT  PHYSICAL EXAM:    VS:  BP 118/68    Pulse 68    Ht 5\' 6"  (1.676 m)    Wt 148 lb (67.1 kg)    SpO2 98%    BMI 23.89 kg/m   BMI: Body mass index is 23.89 kg/m.  GEN: Well nourished, well developed WF, in no acute distress HEENT: normocephalic, atraumatic Neck: no JVD, carotid bruits, or masses Cardiac: RRR; no murmurs, rubs, or gallops, no edema  Respiratory:  clear to auscultation bilaterally, normal work of breathing  GI: soft, nontender, nondistended, + BS MS: no deformity or atrophy Skin: warm and dry, no rash Neuro:  Alert and Oriented x 3, Strength and sensation are intact, follows commands Psych: euthymic mood, full affect  Wt Readings from Last 3 Encounters:  08/13/19 148 lb (67.1 kg)  06/12/19 141 lb 3.2 oz (64 kg)  09/27/17 140 lb (63.5 kg)  Weight variation likely due to knee high boots and additional clothing  ASSESSMENT & PLAN:   1. Palpitations with findings of PVCs, SVT/PAT, and NSVT vs AT with aberration - improved with metoprolol. She has not had any basic bloodwork aside from recent BMET. Will recheck today with ionized calcium level as below, and also obtain baseline TSH, CBC and Mg for review. Continue metoprolol. See below regarding w/u of NICM. 2. Nonischemic cardiomyopathy - discussed with Dr. Radford Hanson. Etiology not entirely clear. No viral precipitant per d/w patient. She observes a very healthy lifestyle otherwise. Coronary CTA was negative for CAD. We will undertake a cMRI to further evaluate for any infiltrative disease. The patient denies any claustrophobia. Await definitive EF assessment by MRI before determining whether any further med titration is necessary at this time. We discussed observation of sodium and fluid limitation. She currently drinks over 100oz by her estimate to keep up with her active lifestyle. Given the degree of exertion she puts in with exercise and the fact that she has not had any clinical heart failure I think this is a  reasonable maximum for now. We did discuss signs of fluid overload to include symptoms and daily weights. Weight variation likely due to knee high boots and additional clothing. She reports this is stable at home. 3. Question of small VSD - I discussed this with Dr. Radford Hanson who confirms this was not seen on coronary CTA. The patient reports being told in the past she had a murmur but then her PCP did not hear one. I see this was documented in Dr. Theodosia Blender exam last visit but I do not appreciate any significant murmurs today. I wonder if this was perhaps related to variable turbulent coronary cusp flow as outlined in echo. The cMRI will also include further information on valvular status. 4. Hypercalcemia - the patient believes this is due to interference from her biotin supplement. She has been off this for over 2 weeks. We will recheck BMET today with ionized calcium level.  Disposition: F/u with Dr. Radford Hanson in 3 months, sooner if needed based on cardiac MRI. We also gave her information on how to sign up for MyChart as she inquired about having easier access to her reports.   Medication Adjustments/Labs and Tests Ordered: Current medicines are reviewed at length with the patient today.  Concerns regarding medicines are outlined above. Medication changes, Labs and Tests ordered today are summarized above and listed in the Patient Instructions accessible in Encounters.   Signed, Charlie Pitter, PA-C  08/13/2019 11:23 AM    [address], [place], [place]  [address] Phone: [phone]; Fax: [phone]

## 2019-08-13 ENCOUNTER — Ambulatory Visit (INDEPENDENT_AMBULATORY_CARE_PROVIDER_SITE_OTHER): Payer: BC Managed Care – PPO | Admitting: Physician Assistant

## 2019-08-13 ENCOUNTER — Other Ambulatory Visit: Payer: Self-pay

## 2019-08-13 ENCOUNTER — Encounter: Payer: Self-pay | Admitting: Physician Assistant

## 2019-08-13 VITALS — BP 118/68 | HR 68 | Ht 66.0 in | Wt 148.0 lb

## 2019-08-13 DIAGNOSIS — R000 Tachycardia, unspecified: Secondary | ICD-10-CM

## 2019-08-13 DIAGNOSIS — I471 Supraventricular tachycardia: Secondary | ICD-10-CM

## 2019-08-13 DIAGNOSIS — I472 Ventricular tachycardia: Secondary | ICD-10-CM | POA: Diagnosis not present

## 2019-08-13 DIAGNOSIS — R931 Abnormal findings on diagnostic imaging of heart and coronary circulation: Secondary | ICD-10-CM

## 2019-08-13 DIAGNOSIS — I428 Other cardiomyopathies: Secondary | ICD-10-CM

## 2019-08-13 DIAGNOSIS — I493 Ventricular premature depolarization: Secondary | ICD-10-CM | POA: Diagnosis not present

## 2019-08-13 NOTE — Patient Instructions (Addendum)
Medication Instructions:  Your physician recommends that you continue on your current medications as directed. Please refer to the Current Medication list given to you today.  *If you need a refill on your cardiac medications before your next appointment, please call your pharmacy*  Lab Work: TODAY: IONIZED CALCIUM, BMET, MAG, CBC, & TSH  If you have labs (blood work) drawn today and your tests are completely normal, you will receive your results only by: Marland Kitchen MyChart Message (if you have MyChart) OR . A paper copy in the mail If you have any lab test that is abnormal or we need to change your treatment, we will call you to review the results.  Testing/Procedures: Your physician has requested that you have a cardiac MRI. Cardiac MRI uses a computer to create images of your heart as its beating, producing both still and moving pictures of your heart and major blood vessels. For further information please visit [email or web address] Please follow the instruction sheet given to you today for more information.    Follow-Up: At [facility], you and your health needs are our priority.  As part of our continuing mission to provide you with exceptional heart care, we have created designated Provider Care Teams.  These Care Teams include your primary Cardiologist (physician) and Advanced Practice Providers (APPs -  Physician Assistants and Nurse Practitioners) who all work together to provide you with the care you need, when you need it.  Your next appointment:   3 month(s)  The format for your next appointment:   In Person  Provider:   You may see Fransico Him, MD or one of the following Advanced Practice Providers on your designated Care Team:    Melina Copa, PA-C  Ermalinda Barrios, PA-C   Other Instructions  Nuclear Medicine Exam A nuclear medicine exam is a safe and painless imaging test. It helps your health care provider detect and diagnose diseases. It also provides information about  the ways your organs work and how they are structured. For a nuclear medicine exam, you will be given a radioactive tracer. This substance is absorbed by your body's organs. A large scanning machine detects the tracer and creates pictures of the areas that your health care provider wants to know more about. There are several kinds of nuclear medicine exams. They include the following:  CT scan.  MRI scan.  PET scan.  SPECT scan. Tell your health care provider about:  Any allergies you have.  All medicines you are taking, including vitamins, herbs, eye drops, creams, and over-the-counter medicines.  Any problems you or family members have had with anesthetic medicines.  Any blood disorders you have.  Any surgeries you have had.  Any medical conditions you have.  Whether you are pregnant or may be pregnant.  Whether you are nursing. What are the risks? Generally, this is a safe procedure. However, problems may occur, such as an allergic reaction to the tracer, but this is rare. What happens before the procedure? Medicines Ask your health care provider about:  Changing or stopping your regular medicines. This is especially important if you are taking diabetes medicines or blood thinners.  Taking medicines such as aspirin and ibuprofen. These medicines can thin your blood. Do not take these medicines unless your health care provider tells you to take them.  Taking over-the-counter medicines, vitamins, herbs, and supplements. General instructions  Follow instructions from your health care provider about eating or drinking restrictions.  Do not wear jewelry.  Wear loose, comfortable clothing.  You may be asked to wear a hospital gown for the procedure.  Bring previous imaging studies, such as X-rays, with you to the exam if they are available. What happens during the procedure?   An IV may be inserted into one of your veins.  You will be asked to lie on a table or sit in  a chair.  You will be given the radioactive tracer. You may get: ? A pill or liquid to swallow. ? An injection. ? Medicine through your IV. ? A gas to inhale.  A large scanning machine will be used to create images of your body. After the pictures are taken, you may have to wait so your health care provider can make sure that enough images were taken. The procedure may vary among health care providers and hospitals. What happens after the procedure?  You may go home after the procedure and return to your usual activities, unless your health care provider tells you otherwise.  Drink enough water to keep your urine pale yellow. This helps to remove the radioactive tracer from your body.  It is up to you to get the results of your procedure. Ask your health care provider, or the department that is doing the procedure, when your results will be ready.  Get help right away if you have problems breathing. Summary  A nuclear medicine exam is a safe and painless imaging test that provides information about how your organs are working. It is also used to detect and diagnose diseases of various body organs.  Follow your health care provider's instructions about eating and drinking restrictions. Ask whether you should change or stop any medicines.  During the procedure, you will be given a radioactive tracer. A large scanning machine will create images of your body.  You may go home after the procedure and return to your regular activities. Follow your health care provider's instructions.  Get help right away if you have problems breathing. This information is not intended to replace advice given to you by your health care provider. Make sure you discuss any questions you have with your health care provider. Document Revised: 05/03/2018 Document Reviewed: 05/03/2018 Elsevier Patient Education  [place].

## 2019-08-14 LAB — BASIC METABOLIC PANEL
BUN/Creatinine Ratio: 22 (ref 9–23)
BUN: 16 mg/dL (ref 6–24)
CO2: 24 mmol/L (ref 20–29)
Calcium: 11.8 mg/dL — ABNORMAL HIGH (ref 8.7–10.2)
Chloride: 103 mmol/L (ref 96–106)
Creatinine, Ser: 0.73 mg/dL (ref 0.57–1.00)
GFR calc Af Amer: 106 mL/min/{1.73_m2} (ref >59)
GFR calc non Af Amer: 92 mL/min/{1.73_m2} (ref >59)
Glucose: 87 mg/dL (ref 65–99)
Potassium: 4.6 mmol/L (ref 3.5–5.2)
Sodium: 139 mmol/L (ref 134–144)

## 2019-08-14 LAB — TSH: TSH: 2.61 u[IU]/mL (ref 0.450–4.500)

## 2019-08-14 LAB — CBC
Hematocrit: 37.6 % (ref 34.0–46.6)
Hemoglobin: 12.7 g/dL (ref 11.1–15.9)
MCH: 30.6 pg (ref 26.6–33.0)
MCHC: 33.8 g/dL (ref 31.5–35.7)
MCV: 91 fL (ref 79–97)
Platelets: 210 10*3/uL (ref 150–450)
RBC: 4.15 x10E6/uL (ref 3.77–5.28)
RDW: 12.1 % (ref 11.7–15.4)
WBC: 4.2 10*3/uL (ref 3.4–10.8)

## 2019-08-14 LAB — CALCIUM, IONIZED: Calcium, Ion: 6.8 mg/dL (ref 4.5–5.6)

## 2019-08-14 LAB — MAGNESIUM: Magnesium: 2 mg/dL (ref 1.6–2.3)

## 2019-08-28 DIAGNOSIS — Z8679 Personal history of other diseases of the circulatory system: Secondary | ICD-10-CM | POA: Diagnosis not present

## 2019-08-28 DIAGNOSIS — Z79899 Other long term (current) drug therapy: Secondary | ICD-10-CM | POA: Diagnosis not present

## 2019-08-28 DIAGNOSIS — Z1331 Encounter for screening for depression: Secondary | ICD-10-CM | POA: Diagnosis not present

## 2019-08-28 DIAGNOSIS — I10 Essential (primary) hypertension: Secondary | ICD-10-CM | POA: Diagnosis not present

## 2019-08-28 DIAGNOSIS — E785 Hyperlipidemia, unspecified: Secondary | ICD-10-CM | POA: Diagnosis not present

## 2019-08-29 ENCOUNTER — Encounter: Payer: Self-pay | Admitting: Cardiology

## 2019-08-29 ENCOUNTER — Telehealth: Payer: Self-pay | Admitting: Cardiology

## 2019-08-29 NOTE — Telephone Encounter (Signed)
Left message regarding appointment for Cardiac MRI scheduled 09/20/19 at 12:00pm--arrival time is 11:15am 1st floor radiology.  Will mail information to patient and it is also in My Chart

## 2019-09-03 ENCOUNTER — Telehealth: Payer: Self-pay | Admitting: Cardiology

## 2019-09-03 NOTE — Telephone Encounter (Signed)
Patient states that she is able to get the COVID vaccine and is wondering whether it is okay as she is still getting testing done to figure out what is going on with her. I advised her that we are recommending that all of our patients receive the vaccine.  She also is calling to let us know that she has a new PCP, Dr. Rica Records, who I have added to her chart.

## 2019-09-03 NOTE — Telephone Encounter (Signed)
Patient states she is calling to inquire about whether or not she is eligible to received the COVID-19 vaccination. I have informed the patient of the instructions.  Patient states she would also like to make Dr. Radford Pax aware that she recently began seeing a new physician. She states she had blood work done on 08/28/19 and the results show her calcium has increased to 12.2. She states her new physician is referring her to an endocrinology. Please call to discuss.

## 2019-09-10 ENCOUNTER — Telehealth: Payer: Self-pay | Admitting: Cardiology

## 2019-09-10 NOTE — Telephone Encounter (Signed)
New Message:   Pt says she is scheduled for a MRI on 09-20-19. She wants them to take a picture of her thyroids  also. She wants to know if Dr Radford Pax would order that also please?

## 2019-09-11 NOTE — Telephone Encounter (Signed)
Patient is calling back because she is now unsure of whether she wants to go through with the cardiac MRI. She states that she is scheduled to see an endocrinologist in April and thinks that the testing that she is going to have done there is more important than the cardiac MRI at this point. She states from a cardiac standpoint she is feeling very good.  Advised on reasons for cardiac MRI including evaluation of infiltrative disease and status of her valves.  She would like to know if Dr. Radford Pax is okay if she holds off on cardiac MRI for now or if she thinks its important that she proceeds with it.

## 2019-09-11 NOTE — Telephone Encounter (Signed)
I would prefer that she proceed with cardiac MRI to help with cardiac management

## 2019-09-11 NOTE — Telephone Encounter (Signed)
Left message for patient to call back  

## 2019-09-12 NOTE — Telephone Encounter (Signed)
Left message for patient letting her know that Dr. Radford Pax would like her to proceed with cardiac MRI.

## 2019-09-20 ENCOUNTER — Other Ambulatory Visit (HOSPITAL_COMMUNITY): Payer: BC Managed Care – PPO

## 2019-10-08 ENCOUNTER — Other Ambulatory Visit: Payer: Self-pay

## 2019-10-10 ENCOUNTER — Encounter: Payer: Self-pay | Admitting: Internal Medicine

## 2019-10-10 ENCOUNTER — Other Ambulatory Visit: Payer: Self-pay

## 2019-10-10 ENCOUNTER — Ambulatory Visit (INDEPENDENT_AMBULATORY_CARE_PROVIDER_SITE_OTHER): Payer: BC Managed Care – PPO | Admitting: Internal Medicine

## 2019-10-10 VITALS — BP 122/68 | HR 66 | Temp 98.1°F | Ht 66.0 in | Wt 144.6 lb

## 2019-10-10 DIAGNOSIS — E213 Hyperparathyroidism, unspecified: Secondary | ICD-10-CM | POA: Diagnosis not present

## 2019-10-10 NOTE — Progress Notes (Signed)
Name: Jessica Hanson  MRN/ DOB: [id number], 1966-06-14    Age/ Sex: 54 y.o., female    PCP: Nicholos Johns, MD   Reason for Endocrinology Evaluation: Hypercalcemia     Date of Initial Endocrinology Evaluation: 10/10/2019     HPI: Jessica Hanson is a 54 y.o. female with a past medical history of HTN and Dyslipidemia. The patient presented for initial endocrinology clinic visit on 10/10/2019 for consultative assistance with her hypercalcemia .   Jessica Hanson indicates that she was first diagnosed with hypercalcemia in 2019. Since that time, she denies experienced symptoms of constipation, polyuria, polydipsia, generalized weakness, diffuse muscle pains, significant memory impairment. She stopped the  use of over the counter calcium (including supplements, Tums, Rolaids, or other calcium containing antacids) as well as Vitamin D supplements. She is not on lithium, or HCTZ  She has history of kidney stones- years ago, but denies kidney disease, liver disease, granulomatous disease. She denies osteoporosis or prior fractures. Daily dietary calcium intake: reduced it in the past 6 weeks .  She denies  family history of osteoporosis, parathyroid disease, thyroid disease.     HISTORY:  Past Medical History:  Past Medical History:  Diagnosis Date  . DVT (deep venous thrombosis) ([place])   . Hypoglycemia   . NICM (nonischemic cardiomyopathy) ([place])   . Ovarian cyst   . PAT (paroxysmal atrial tachycardia) ([place])   . PVC's (premature ventricular contractions)   . SVT (supraventricular tachycardia) ([place])   . Venous hypertension   . Weakness of lower extremity   . Wide-complex tachycardia ([place])    a. event monitor 06/2019 - possible NSVT vs AT with aberration   Past Surgical History:  Past Surgical History:  Procedure Laterality Date  . ENDOVENOUS ABLATION SAPHENOUS VEIN W/ LASER        Social History:  reports that she has never smoked. She has never used smokeless  tobacco. She reports current alcohol use. She reports that she does not use drugs.  Family History: family history includes Varicose Veins in her maternal grandmother and sister.   HOME MEDICATIONS: Allergies as of 10/10/2019      Reactions   Hydrocortisone    Oxycodone Nausea Only   Sulfa Antibiotics Other (See Comments)   hallucinations      Medication List       Accurate as of October 10, 2019  3:06 PM. If you have any questions, ask your nurse or doctor.        aspirin EC 81 MG tablet Take 81 mg by mouth daily.   diphenhydrAMINE 25 MG tablet Commonly known as: BENADRYL Take 25 mg by mouth every 6 (six) hours as needed for allergies.   Fish Oil Triple Strength 1400 MG Caps Take by mouth as directed.   melatonin 5 MG Tabs Take by mouth at bedtime.   metoprolol succinate 25 MG 24 hr tablet Commonly known as: Toprol XL Take 1 tablet (25 mg total) by mouth daily.   OVER THE COUNTER MEDICATION Prevogen improve memory   pyridOXINE 100 MG tablet Commonly known as: VITAMIN B-6 Take 100 mg by mouth daily.   Turmeric 450 MG Caps Take by mouth as directed.   vitamin B-12 1000 MCG tablet Commonly known as: CYANOCOBALAMIN Take 1,000 mcg by mouth daily.   Vitamin C 500 MG Chew Chew 500 mg by mouth 2 (two) times daily.   zinc gluconate 50 MG tablet Take 50 mg by mouth daily.  REVIEW OF SYSTEMS: A comprehensive ROS was conducted with the patient and is negative except as per HPI     OBJECTIVE:  VS: BP 122/68 (BP Location: Left Arm, Patient Position: Sitting, Cuff Size: Normal)   Pulse 66   Temp 98.1 F (36.7 C)   Ht 5' 6"  (1.676 m)   Wt 144 lb 9.6 oz (65.6 kg)   SpO2 98%   BMI 23.34 kg/m    Wt Readings from Last 3 Encounters:  10/10/19 144 lb 9.6 oz (65.6 kg)  08/13/19 148 lb (67.1 kg)  06/12/19 141 lb 3.2 oz (64 kg)     EXAM: General: Pt appears well and is in NAD  Neck: General: Supple without adenopathy. Thyroid: Thyroid size normal.   No goiter or nodules appreciated. No thyroid bruit.  Lungs: Clear with good BS bilat with no rales, rhonchi, or wheezes  Heart: Auscultation: RRR.  Abdomen: Normoactive bowel sounds, soft, nontender, without masses or organomegaly palpable  Extremities:  BL LE: No pretibial edema .  Skin: Hair: Texture and amount normal with gender appropriate distribution Skin Inspection: No rashes Skin Palpation: Skin temperature, texture, and thickness normal to palpation  Neuro: Cranial nerves: II - XII grossly intact  Motor: Normal strength throughout DTRs: 2+ and symmetric in UE without delay in relaxation phase  Mental Status: Judgment, insight: Intact Orientation: Oriented to time, place, and person Mood and affect: No depression, anxiety, or agitation     DATA REVIEWED:  08/2019 PTH 102 pg/mL  BUN/Cr 13/0.69 GFR 98 K 4.5 Ca 12.2 Alb 4.8  Alk Phos 180   ASSESSMENT/PLAN/RECOMMENDATIONS:   1. Hyperparathyroidism:  - We discussed differential of familial hypercalcinuric hypercalcemia ([place]) vs Primary Hyperparathyroidism (pHPT)  - It is important to differentiate between the two, as [place] does not cause any organ damage and does not require further follow up , on the other hand pHPT could cause end organ damage and would require further evaluation.  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 - Will proceed with 24-hr urine collection   Recommendations : - Encouraged hydration  - AVOID CALCIUM SUPPLEMENTS, AVOID LOW CALCIUM DIET - Maintain normal dietary calcium intake (2-3 servings of dairy a day) - Start Vitamin D3 1000 iu daily   F/U in 3 months   Signed electronically by: Mack Guise, MD  [facility]  [facility] [address] [place], [place] [address] Phone: [phone] FAX: [phone]   CC: Nicholos Johns, [address] [place] [address] Phone: [phone] Fax: [phone]   Return to  Endocrinology clinic as below: Future Appointments  Date Time Provider [place]  11/12/2019  1:00 PM Sueanne Margarita, MD [facility]  01/14/2020  1:40 PM Senta Kantor, Melanie Crazier, MD [facility] None

## 2019-10-10 NOTE — Patient Instructions (Addendum)
-   Please stay Hydrated  - AVOID CALCIUM SUPPLEMENTS, AVOID LOW CALCIUM DIET - Maintain normal dietary calcium intake (2-3 servings of dairy a day) - Start Vitamin D3 1000 iu daily      24-Hour Urine Collection   You will be collecting your urine for a 24-hour period of time.  Your timer starts with your first urine of the morning (For example - If you first pee at [place], your timer will start at [place])  [place] away your first urine of the morning  Collect your urine every time you pee for the next 24 hours STOP your urine collection 24 hours after you started the collection (For example - You would stop at 9AM the day after you started)    FOODS THAT CONTAIN CALCIUM  Calcium can be found in many foods, not only in dairy products.  Dairy Foods  Yogurt (1 cup) 350 mg  Milk (1 cup) 300 mg  Cheddar cheese (1 oz.) 204 mg  Ricotta cheese, part skim (1/4 cup) 169 mg  Cottage cheese (1 cup) 150 mg  Nondairy Foods  Whole Grain Total cereal (3/4 cup) 1000 mg  Pink salmon with bones, sardines (3 oz., cooked) 181 mg  Black beans (1 cup) 103 mg  Broccoli (1 cup, cooked) 150 mg  Almonds (1 tbsp.) 50 mg  Soy Products  Soy yogurt with calcium (3/4 cup) 300 mg  Soy milk enriched with calcium (1 cup) 300 mg  Tofu, firm or extra firm (1/4 cup) 250 mg  Soy nuts, roasted/salted (1/2 cup) 103 mg

## 2019-10-11 ENCOUNTER — Telehealth: Payer: Self-pay | Admitting: *Deleted

## 2019-10-11 ENCOUNTER — Encounter: Payer: Self-pay | Admitting: Internal Medicine

## 2019-10-11 NOTE — Telephone Encounter (Signed)
Called patient to reschedule the Cardiac MRI that was cancelled 09/20/19---patient states she does not wish to reschedule at this time.

## 2019-10-12 ENCOUNTER — Other Ambulatory Visit (INDEPENDENT_AMBULATORY_CARE_PROVIDER_SITE_OTHER): Payer: BC Managed Care – PPO

## 2019-10-12 ENCOUNTER — Other Ambulatory Visit: Payer: Self-pay

## 2019-10-12 DIAGNOSIS — E213 Hyperparathyroidism, unspecified: Secondary | ICD-10-CM | POA: Diagnosis not present

## 2019-10-12 LAB — VITAMIN D 25 HYDROXY (VIT D DEFICIENCY, FRACTURES): VITD: 53.2 ng/mL (ref 30.00–100.00)

## 2019-10-12 LAB — BASIC METABOLIC PANEL
BUN: 17 mg/dL (ref 6–23)
CO2: 30 mEq/L (ref 19–32)
Calcium: 11.2 mg/dL — ABNORMAL HIGH (ref 8.4–10.5)
Chloride: 103 mEq/L (ref 96–112)
Creatinine, Ser: 0.75 mg/dL (ref 0.40–1.20)
GFR: 79.88 mL/min (ref >60.00)
Glucose, Bld: 75 mg/dL (ref 70–99)
Potassium: 3.6 mEq/L (ref 3.5–5.1)
Sodium: 140 mEq/L (ref 135–145)

## 2019-10-12 LAB — ALBUMIN: Albumin: 4.6 g/dL (ref 3.5–5.2)

## 2019-10-15 LAB — CALCIUM, URINE, 24 HOUR: Calcium, 24H Urine: 258 mg/24 h — ABNORMAL HIGH

## 2019-10-15 LAB — PARATHYROID HORMONE, INTACT (NO CA): PTH: 107 pg/mL — ABNORMAL HIGH (ref 14–64)

## 2019-10-15 LAB — CREATININE, URINE, 24 HOUR: Creatinine, 24H Ur: 1.18 g/(24.h) (ref 0.50–2.15)

## 2019-10-16 ENCOUNTER — Telehealth: Payer: Self-pay | Admitting: Endocrinology

## 2019-10-16 NOTE — Telephone Encounter (Signed)
Spoke to pt and explained that she can get her results from My Chart but went over was in her results message.

## 2019-10-16 NOTE — Telephone Encounter (Signed)
Patient was calling to get her results from her most recent lab work

## 2019-10-30 DIAGNOSIS — R112 Nausea with vomiting, unspecified: Secondary | ICD-10-CM | POA: Diagnosis not present

## 2019-10-30 DIAGNOSIS — M549 Dorsalgia, unspecified: Secondary | ICD-10-CM | POA: Diagnosis not present

## 2019-11-06 DIAGNOSIS — J309 Allergic rhinitis, unspecified: Secondary | ICD-10-CM | POA: Diagnosis not present

## 2019-11-06 DIAGNOSIS — R112 Nausea with vomiting, unspecified: Secondary | ICD-10-CM | POA: Diagnosis not present

## 2019-11-06 DIAGNOSIS — M549 Dorsalgia, unspecified: Secondary | ICD-10-CM | POA: Diagnosis not present

## 2019-11-06 DIAGNOSIS — J45909 Unspecified asthma, uncomplicated: Secondary | ICD-10-CM | POA: Diagnosis not present

## 2019-11-07 DIAGNOSIS — J0100 Acute maxillary sinusitis, unspecified: Secondary | ICD-10-CM | POA: Diagnosis not present

## 2019-11-07 DIAGNOSIS — J209 Acute bronchitis, unspecified: Secondary | ICD-10-CM | POA: Diagnosis not present

## 2019-11-12 ENCOUNTER — Ambulatory Visit: Payer: BC Managed Care – PPO | Admitting: Cardiology

## 2019-11-15 ENCOUNTER — Ambulatory Visit: Payer: BC Managed Care – PPO | Admitting: Cardiology

## 2019-11-20 DIAGNOSIS — R3129 Other microscopic hematuria: Secondary | ICD-10-CM | POA: Diagnosis not present

## 2019-11-20 DIAGNOSIS — N390 Urinary tract infection, site not specified: Secondary | ICD-10-CM | POA: Diagnosis not present

## 2019-11-20 DIAGNOSIS — R300 Dysuria: Secondary | ICD-10-CM | POA: Diagnosis not present

## 2019-12-04 ENCOUNTER — Telehealth: Payer: Self-pay | Admitting: *Deleted

## 2019-12-04 ENCOUNTER — Telehealth: Payer: Self-pay

## 2019-12-04 DIAGNOSIS — J45909 Unspecified asthma, uncomplicated: Secondary | ICD-10-CM | POA: Diagnosis not present

## 2019-12-04 DIAGNOSIS — Z79899 Other long term (current) drug therapy: Secondary | ICD-10-CM | POA: Diagnosis not present

## 2019-12-04 DIAGNOSIS — E785 Hyperlipidemia, unspecified: Secondary | ICD-10-CM | POA: Diagnosis not present

## 2019-12-04 DIAGNOSIS — Z8679 Personal history of other diseases of the circulatory system: Secondary | ICD-10-CM | POA: Diagnosis not present

## 2019-12-04 DIAGNOSIS — I10 Essential (primary) hypertension: Secondary | ICD-10-CM | POA: Diagnosis not present

## 2019-12-04 NOTE — Telephone Encounter (Signed)
I will look up fax# and fax

## 2019-12-04 NOTE — Telephone Encounter (Signed)
Patient is requesting lab results and chart notes from her last visit with Dr. Kelton Pillar be faxed to Dr. Lauralee Evener office

## 2019-12-04 NOTE — Telephone Encounter (Signed)
Spoke with patient regarding Cardiac MRI ordered by Dr. Viviana Simpler cancelled 09/20/19 appointment and she states she does not wish to reschedule the appointment.

## 2019-12-17 ENCOUNTER — Telehealth: Payer: Self-pay | Admitting: *Deleted

## 2019-12-17 ENCOUNTER — Encounter: Payer: Self-pay | Admitting: *Deleted

## 2019-12-17 NOTE — Telephone Encounter (Signed)
Left message for patient to call regarding appointment for Cardiac MRI ordered by Melina Copa, PA

## 2019-12-17 NOTE — Telephone Encounter (Signed)
Patient called back----Cardiac MRI scheduled Friday  01/11/20 at 9:00 am at [facility]---arrival time is 8:15 am--1st floor admissions office.  Will mail information to patient and it is in My Chart.  Patient voiced her understanding.

## 2019-12-19 ENCOUNTER — Ambulatory Visit: Payer: 59 | Attending: GYNECOLOGICAL/ONCOLOGY

## 2019-12-19 ENCOUNTER — Other Ambulatory Visit: Payer: Self-pay

## 2019-12-19 DIAGNOSIS — D649 Anemia, unspecified: Secondary | ICD-10-CM

## 2019-12-19 LAB — CBC
HCT: 34.7 % — ABNORMAL LOW (ref 34.8–46.0)
HGB: 10.5 g/dL — ABNORMAL LOW (ref 11.5–16.0)
MCH: 29.3 pg (ref 26.0–32.0)
MCHC: 30.3 g/dL — ABNORMAL LOW (ref 31.0–35.5)
MCV: 96.9 fL (ref 78.0–100.0)
MPV: 12.1 fL (ref 8.7–12.5)
PLATELETS: 225 10*3/uL (ref 150–400)
RBC: 3.58 10*6/uL — ABNORMAL LOW (ref 3.85–5.22)
RDW-CV: 13.7 % (ref 11.5–15.5)
WBC: 7.3 10*3/uL (ref 3.7–11.0)

## 2019-12-21 ENCOUNTER — Other Ambulatory Visit: Payer: Self-pay

## 2019-12-21 ENCOUNTER — Ambulatory Visit: Payer: 59 | Attending: GYNECOLOGICAL/ONCOLOGY

## 2019-12-21 DIAGNOSIS — D649 Anemia, unspecified: Secondary | ICD-10-CM | POA: Insufficient documentation

## 2019-12-21 LAB — CBC
HCT: 35.5 % (ref 34.8–46.0)
HGB: 11.1 g/dL — ABNORMAL LOW (ref 11.5–16.0)
MCH: 29.5 pg (ref 26.0–32.0)
MCHC: 31.3 g/dL (ref 31.0–35.5)
MCV: 94.4 fL (ref 78.0–100.0)
MPV: 11.7 fL (ref 8.7–12.5)
PLATELETS: 246 10*3/uL (ref 150–400)
RBC: 3.76 10*6/uL — ABNORMAL LOW (ref 3.85–5.22)
RDW-CV: 13.5 % (ref 11.5–15.5)
WBC: 10 10*3/uL (ref 3.7–11.0)

## 2019-12-26 ENCOUNTER — Other Ambulatory Visit: Payer: Self-pay

## 2019-12-26 ENCOUNTER — Ambulatory Visit: Payer: 59 | Attending: GYNECOLOGICAL/ONCOLOGY

## 2019-12-26 DIAGNOSIS — D649 Anemia, unspecified: Secondary | ICD-10-CM | POA: Insufficient documentation

## 2019-12-26 LAB — CBC
HCT: 37.6 % (ref 34.8–46.0)
HGB: 11.6 g/dL (ref 11.5–16.0)
MCH: 29.5 pg (ref 26.0–32.0)
MCHC: 30.9 g/dL — ABNORMAL LOW (ref 31.0–35.5)
MCV: 95.7 fL (ref 78.0–100.0)
MPV: 12.4 fL (ref 8.7–12.5)
PLATELETS: 295 10*3/uL (ref 150–400)
RBC: 3.93 10*6/uL (ref 3.85–5.22)
RDW-CV: 13.3 % (ref 11.5–15.5)
WBC: 7.2 10*3/uL (ref 3.7–11.0)

## 2020-01-10 ENCOUNTER — Telehealth (HOSPITAL_COMMUNITY): Payer: Self-pay | Admitting: *Deleted

## 2020-01-10 NOTE — Telephone Encounter (Signed)
Attempted to call patient regarding upcoming cardiac MRI appointment. Left message on voicemail with name and callback number  Domanique Luckett Tai RN Navigator Cardiac Imaging  Heart and Vascular Services [phone] Office [phone] Cell  

## 2020-01-11 ENCOUNTER — Other Ambulatory Visit: Payer: Self-pay

## 2020-01-11 ENCOUNTER — Ambulatory Visit (HOSPITAL_COMMUNITY)
Admission: RE | Admit: 2020-01-11 | Discharge: 2020-01-11 | Disposition: A | Discharge status: Home / Self Care | Payer: BC Managed Care – PPO | Source: Ambulatory Visit | Attending: Physician Assistant | Admitting: Physician Assistant

## 2020-01-11 DIAGNOSIS — I428 Other cardiomyopathies: Secondary | ICD-10-CM | POA: Diagnosis not present

## 2020-01-11 DIAGNOSIS — I340 Nonrheumatic mitral (valve) insufficiency: Secondary | ICD-10-CM | POA: Diagnosis not present

## 2020-01-11 DIAGNOSIS — I341 Nonrheumatic mitral (valve) prolapse: Secondary | ICD-10-CM | POA: Diagnosis not present

## 2020-01-11 MED ORDER — GADOBUTROL 1 MMOL/ML IV SOLN
6.0000 mL | Freq: Once | INTRAVENOUS | Status: AC | PRN
  Administered 2020-01-11: 6 mL via INTRAVENOUS

## 2020-01-14 ENCOUNTER — Telehealth: Payer: Self-pay | Admitting: *Deleted

## 2020-01-14 ENCOUNTER — Ambulatory Visit: Payer: BC Managed Care – PPO | Admitting: Internal Medicine

## 2020-01-14 DIAGNOSIS — I340 Nonrheumatic mitral (valve) insufficiency: Secondary | ICD-10-CM

## 2020-01-14 DIAGNOSIS — Q2542 Hypoplasia of aorta: Secondary | ICD-10-CM

## 2020-01-14 NOTE — Telephone Encounter (Signed)
-----   Message from Charlie Pitter, [place] sent at 01/14/2020 10:57 AM EDT ----- Please let pt know MRI looks good - EF is actually normal which is great news. No evidence of scar or abnormal infiltrative disease. There is mild-moderate leaking of the mitral valve and possibly a small VSD (hole in the heart) - no significant shunt so neither of these are causing any issues we need to do anything about. Dr. Radford Pax requests a f/u echo in 1 year. Otherwise continue plan as discussed.

## 2020-03-10 DIAGNOSIS — Z8679 Personal history of other diseases of the circulatory system: Secondary | ICD-10-CM | POA: Diagnosis not present

## 2020-03-10 DIAGNOSIS — I10 Essential (primary) hypertension: Secondary | ICD-10-CM | POA: Diagnosis not present

## 2020-03-10 DIAGNOSIS — E785 Hyperlipidemia, unspecified: Secondary | ICD-10-CM | POA: Diagnosis not present

## 2020-03-24 ENCOUNTER — Encounter: Payer: Self-pay | Admitting: Cardiology

## 2020-03-24 ENCOUNTER — Telehealth: Payer: Self-pay | Admitting: Cardiology

## 2020-03-24 DIAGNOSIS — I428 Other cardiomyopathies: Secondary | ICD-10-CM | POA: Insufficient documentation

## 2020-03-24 DIAGNOSIS — Q210 Ventricular septal defect: Secondary | ICD-10-CM | POA: Insufficient documentation

## 2020-03-24 DIAGNOSIS — I341 Nonrheumatic mitral (valve) prolapse: Secondary | ICD-10-CM | POA: Insufficient documentation

## 2020-03-24 NOTE — Telephone Encounter (Signed)
Spoke with the patient who states that she is scheduled to give blood tomorrow and is wanting to know if that is okay. I advised her that I did not see any reason that she would not be able to donate. Patient would like to know if Dr. Radford Pax thinks that this is okay. She is also waiting to hear back from her PCP in regards to this.

## 2020-03-24 NOTE — Telephone Encounter (Signed)
Left message for patient letting her know that per Dr. Radford Pax it is okay for her to give blood.

## 2020-03-24 NOTE — Telephone Encounter (Signed)
She is fine to donate blood as long as BP is ok

## 2020-03-24 NOTE — Telephone Encounter (Signed)
Patient would like to know if it is okay for her to give blood again.

## 2020-03-31 ENCOUNTER — Ambulatory Visit: Payer: BC Managed Care – PPO | Attending: FAMILY PRACTICE

## 2020-03-31 ENCOUNTER — Other Ambulatory Visit: Payer: Self-pay

## 2020-03-31 DIAGNOSIS — Z20828 Contact with and (suspected) exposure to other viral communicable diseases: Secondary | ICD-10-CM | POA: Insufficient documentation

## 2020-03-31 DIAGNOSIS — Z20822 Contact with and (suspected) exposure to covid-19: Secondary | ICD-10-CM | POA: Insufficient documentation

## 2020-03-31 DIAGNOSIS — J029 Acute pharyngitis, unspecified: Secondary | ICD-10-CM

## 2020-03-31 DIAGNOSIS — R059 Cough, unspecified: Secondary | ICD-10-CM

## 2020-04-01 LAB — COVID-19 SCREENING - SEND-OUT: SARS-COV-2 RNA: NOT DETECTED

## 2020-05-01 ENCOUNTER — Other Ambulatory Visit: Payer: Self-pay

## 2020-05-01 ENCOUNTER — Ambulatory Visit: Payer: BC Managed Care – PPO | Attending: GYNECOLOGICAL/ONCOLOGY

## 2020-05-01 ENCOUNTER — Ambulatory Visit (HOSPITAL_COMMUNITY): Payer: BC Managed Care – PPO

## 2020-05-01 DIAGNOSIS — Z1159 Encounter for screening for other viral diseases: Secondary | ICD-10-CM

## 2020-05-01 DIAGNOSIS — Z20822 Contact with and (suspected) exposure to covid-19: Secondary | ICD-10-CM | POA: Insufficient documentation

## 2020-05-01 DIAGNOSIS — Z01818 Encounter for other preprocedural examination: Secondary | ICD-10-CM

## 2020-05-01 DIAGNOSIS — Z01812 Encounter for preprocedural laboratory examination: Secondary | ICD-10-CM | POA: Insufficient documentation

## 2020-05-01 DIAGNOSIS — N903 Dysplasia of vulva, unspecified: Secondary | ICD-10-CM | POA: Insufficient documentation

## 2020-05-01 LAB — COMPREHENSIVE METABOLIC PNL, FASTING
ALBUMIN/GLOBULIN RATIO: 1.4 (ref >=1.0)
ALBUMIN: 4.5 g/dL (ref 3.5–5.2)
ALKALINE PHOSPHATASE: 63 U/L (ref 41–133)
ALT (SGPT): 12 U/L (ref 0–55)
ANION GAP: 8 mmol/L (ref 6–15)
AST (SGOT): 19 U/L (ref 5–34)
BILIRUBIN TOTAL: 0.5 mg/dL (ref 0.2–1.2)
BUN: 17 mg/dL (ref 7–21)
CALCIUM: 9.6 mg/dL (ref 8.0–10.6)
CHLORIDE: 103 mmol/L (ref 98–107)
CO2 TOTAL: 29 mmol/L (ref 21–32)
CREATININE: 0.75 mg/dL — ABNORMAL LOW (ref 0.80–1.60)
ESTIMATED GFR: >60 mL/min/{1.73_m2}
GLUCOSE: 91 mg/dL (ref 70–100)
POTASSIUM: 4.1 mmol/L (ref 3.3–5.1)
PROTEIN TOTAL: 7.8 g/dL (ref 6.4–8.3)
SODIUM: 140 mmol/L (ref 136–146)

## 2020-05-01 LAB — CBC WITH DIFF
BASOPHIL #: <0.1 10*3/uL (ref <=0.20)
BASOPHIL %: 1 %
EOSINOPHIL #: 0.19 10*3/uL (ref <=0.50)
EOSINOPHIL %: 3 %
HCT: 44.2 % (ref 34.8–46.0)
HGB: 13.8 g/dL (ref 11.5–16.0)
IMMATURE GRANULOCYTE #: <0.1 10*3/uL (ref <0.10)
IMMATURE GRANULOCYTE %: 0 % (ref 0–1)
LYMPHOCYTE #: 1.54 10*3/uL (ref 1.00–4.80)
LYMPHOCYTE %: 28 %
MCH: 28.6 pg (ref 26.0–32.0)
MCHC: 31.2 g/dL (ref 31.0–35.5)
MCV: 91.5 fL (ref 78.0–100.0)
MONOCYTE #: 0.5 10*3/uL (ref 0.20–1.10)
MONOCYTE %: 9 %
MPV: 12.5 fL (ref 8.7–12.5)
NEUTROPHIL #: 3.28 10*3/uL (ref 1.50–7.70)
NEUTROPHIL %: 59 %
PLATELETS: 198 10*3/uL (ref 150–400)
RBC: 4.83 10*6/uL (ref 3.85–5.22)
RDW-CV: 13.2 % (ref 11.5–15.5)
WBC: 5.6 10*3/uL (ref 3.7–11.0)

## 2020-05-03 LAB — COVID-19 SCREENING - SEND-OUT: SARS-COV-2 RNA: NOT DETECTED

## 2020-05-13 DIAGNOSIS — Z6823 Body mass index (BMI) 23.0-23.9, adult: Secondary | ICD-10-CM | POA: Diagnosis not present

## 2020-05-13 DIAGNOSIS — Z01419 Encounter for gynecological examination (general) (routine) without abnormal findings: Secondary | ICD-10-CM | POA: Diagnosis not present

## 2020-05-13 DIAGNOSIS — Z1231 Encounter for screening mammogram for malignant neoplasm of breast: Secondary | ICD-10-CM | POA: Diagnosis not present

## 2020-05-13 DIAGNOSIS — Z124 Encounter for screening for malignant neoplasm of cervix: Secondary | ICD-10-CM | POA: Diagnosis not present

## 2020-06-05 ENCOUNTER — Encounter: Payer: Self-pay | Admitting: Cardiology

## 2020-06-05 ENCOUNTER — Ambulatory Visit (INDEPENDENT_AMBULATORY_CARE_PROVIDER_SITE_OTHER): Payer: BC Managed Care – PPO | Admitting: Cardiology

## 2020-06-05 ENCOUNTER — Other Ambulatory Visit: Payer: Self-pay

## 2020-06-05 VITALS — BP 158/84 | HR 63 | Ht 66.0 in | Wt 144.3 lb

## 2020-06-05 DIAGNOSIS — I472 Ventricular tachycardia: Secondary | ICD-10-CM | POA: Diagnosis not present

## 2020-06-05 DIAGNOSIS — Q210 Ventricular septal defect: Secondary | ICD-10-CM | POA: Diagnosis not present

## 2020-06-05 DIAGNOSIS — I341 Nonrheumatic mitral (valve) prolapse: Secondary | ICD-10-CM

## 2020-06-05 DIAGNOSIS — T733XXA Exhaustion due to excessive exertion, initial encounter: Secondary | ICD-10-CM

## 2020-06-05 DIAGNOSIS — R000 Tachycardia, unspecified: Secondary | ICD-10-CM

## 2020-06-05 DIAGNOSIS — T733XXD Exhaustion due to excessive exertion, subsequent encounter: Secondary | ICD-10-CM

## 2020-06-05 DIAGNOSIS — I471 Supraventricular tachycardia: Secondary | ICD-10-CM | POA: Diagnosis not present

## 2020-06-05 MED ORDER — METOPROLOL SUCCINATE ER 25 MG PO TB24: 25.0000 mg | ORAL_TABLET | Freq: Every day | ORAL | 3 refills | Status: DC

## 2020-06-05 NOTE — Patient Instructions (Signed)

## 2020-06-05 NOTE — Progress Notes (Addendum)
[facility]    Cardiology Consult  Note    Date:  06/05/2020   ID:  Marlon Vonruden, DOB May 12, 1966, MRN [id number]  PCP:  Nicholos Johns, MD  Cardiologist:  Fransico Him, MD   Chief Complaint  Patient presents with  . Follow-up    Atrial tachycardia, WCT, VSD, MVP    History of Present Illness:  Jessica Hanson is a 54 y.o. female  with a hx of DVT on Eliquis, nonsustained atrial tachycardia, wide complex tachycardia noted on event monitor done for palpitations and exertional fatigue.  Coronary CTA showed a Ca score of 0 and no CAD.  2D echo showed mild LV dysfunction with EF 40-45% with bileaflet MVP with trivial MR.   cMRI done for further evaluation of low EF showed normal LVF, no LGE with possible small perimembranous VSD also noted on 2D echo but no significant shunt.  There was also bileaflet MVP with mild to moderate MR.  She was started on Toprol XL 25mg  daily.    She is here today for followup and is doing well.  Since going on the BB her palpitations have resolved.  Her exertional fatigue has resolved completely was well.  She denies any chest pain or pressure, SOB, DOE, PND, orthopnea, LE edema, dizziness, palpitations or syncope. She is compliant with her meds and is tolerating meds with no SE.    Past Medical History:  Diagnosis Date  . DVT (deep venous thrombosis) ([place])   . Hypoglycemia   . MVP (mitral valve prolapse)    bileaflet MVP and mild to moderate MR on cardiac MRI  . NICM (nonischemic cardiomyopathy) ([place])    EF reduced on echo but normal on cardiac MRI with no LGE - EF 55%  . Ovarian cyst   . PAT (paroxysmal atrial tachycardia) ([place])   . PVC's (premature ventricular contractions)   . SVT (supraventricular tachycardia) ([place])   . Venous hypertension   . VSD (ventricular septal defect), perimembranous    very small on cardiac MRI 12/2019  . Weakness of lower extremity   . Wide-complex tachycardia ([place])    a. event monitor 06/2019 - possible NSVT vs  AT with aberration    Past Surgical History:  Procedure Laterality Date  . ENDOVENOUS ABLATION SAPHENOUS VEIN W/ LASER      Current Medications: Current Meds  Medication Sig  . Ascorbic Acid (VITAMIN C) 500 MG CHEW Chew 500 mg by mouth 2 (two) times daily.  Marland Kitchen aspirin EC 81 MG tablet Take 81 mg by mouth daily.  . diphenhydrAMINE (BENADRYL) 25 MG tablet Take 25 mg by mouth every 6 (six) hours as needed for allergies.  . Melatonin 5 MG TABS Take by mouth at bedtime.  . metoprolol succinate (TOPROL XL) 25 MG 24 hr tablet Take 1 tablet (25 mg total) by mouth daily.  . Omega-3 Fatty Acids (FISH OIL TRIPLE STRENGTH) 1400 MG CAPS Take by mouth as directed.  Marland Kitchen OVER THE COUNTER MEDICATION Prevogen improve memory  . pyridOXINE (VITAMIN B-6) 100 MG tablet Take 100 mg by mouth daily.  . Turmeric 450 MG CAPS Take by mouth as directed.  . vitamin B-12 (CYANOCOBALAMIN) 1000 MCG tablet Take 1,000 mcg by mouth daily.  Marland Kitchen zinc gluconate 50 MG tablet Take 50 mg by mouth daily.    Allergies:   Hydrocortisone, Oxycodone, and Sulfa antibiotics   Social History   Socioeconomic History  . Marital status: Married    Spouse name: Not on file  .  Number of children: Not on file  . Years of education: Not on file  . Highest education level: Not on file  Occupational History  . Not on file  Tobacco Use  . Smoking status: Never Smoker  . Smokeless tobacco: Never Used  Substance and Sexual Activity  . Alcohol use: Yes    Comment: occ  . Drug use: No  . Sexual activity: Not on file  Other Topics Concern  . Not on file  Social History Narrative  . Not on file   Social Determinants of Health   Financial Resource Strain: Not on file  Food Insecurity: Not on file  Transportation Needs: Not on file  Physical Activity: Not on file  Stress: Not on file  Social Connections: Not on file     Family History:  The patient's family history includes Varicose Veins in her maternal grandmother and sister.    ROS:   Please see the history of present illness.    ROS All other systems reviewed and are negative.  No flowsheet data found.     PHYSICAL EXAM:   VS:  BP (!) 158/84   Pulse 63   Ht 5\' 6"  (1.676 m)   Wt 144 lb 4.8 oz (65.5 kg)   SpO2 96%   BMI 23.29 kg/m    G GEN: Well nourished, well developed in no acute distress HEENT: Normal NECK: No JVD; No carotid bruits LYMPHATICS: No lymphadenopathy CARDIAC:RRR, no rubs, gallops.  Mid systolic click with 2/6 late systolic murmur at the LLSB and apex. RESPIRATORY:  Clear to auscultation without rales, wheezing or rhonchi  ABDOMEN: Soft, non-tender, non-distended MUSCULOSKELETAL:  No edema; No deformity  SKIN: Warm and dry NEUROLOGIC:  Alert and oriented x 3 PSYCHIATRIC:  Normal affect    Wt Readings from Last 3 Encounters:  06/05/20 144 lb 4.8 oz (65.5 kg)  10/10/19 144 lb 9.6 oz (65.6 kg)  08/13/19 148 lb (67.1 kg)      Studies/Labs Reviewed:   EKG:  EKG is ordered today.  The ekg ordered today demonstrates NSR with no ST changes Coronary CTA 06/2019 IMPRESSION: 1. Coronary calcium score of 0. This was 0 percentile for age and sex matched control.  2. Normal coronary origin with right dominance.  3. No evidence of CAD.  4. Consider non-atherosclerotic causes of chest pain.  2D echo 05/2019 IMPRESSIONS   1. Left ventricular ejection fraction, by visual estimation, is 40 to  45%. The left ventricle has normal function. There is no left ventricular  hypertrophy.  2. Multiple segmental abnormalities exist. See findings.  3. Mildly dilated left ventricular internal cavity size.  4. The left ventricle demonstrates regional wall motion abnormalities.  5. Global right ventricle has normal systolic function.The right  ventricular size is normal. No increase in right ventricular wall  thickness.  6. Left atrial size was normal.  7. Right atrial size was normal.  8. Mild mitral valve prolapse.  9. The  mitral valve is normal in structure. Trivial mitral valve  regurgitation. No evidence of mitral stenosis.  10. The tricuspid valve is normal in structure.  11. The aortic valve is tricuspid. Aortic valve regurgitation is not  visualized. No evidence of aortic valve sclerosis or stenosis.  12. The pulmonic valve was normal in structure. Pulmonic valve  regurgitation is not visualized.  13. The inferior vena cava is normal in size with greater than 50%  respiratory variability, suggesting right atrial pressure of 3 mmHg.  14. MIld bileaflet  mitral valve prolapse.  15. On parasternal short images (img 31, 32) there is turbulent flow seen  at the border of the tricuspid annulus and RCC. Differential includes  small perimembranous VSD vs. turbulent right coronary cusp flow. Not seen  in other imaging planes.  16. There appears to be focal wall motion abnormalities, most prominent in  the apical anteroseptal and anterior walls. More mild hypokinesis of these  segments toward the base.   Event Monitor 06/2019 Study Highlights   Sinus bradycardia, normal sinus rhythm and sinus tachcyardia. The average heart rate was 72bpm and ranged from 41 to 152bpm.  Frequent PVCs, ventricular couplets and bigeminal PVCs with PVC load 4.8%.  Wide complex tachycardia possible nonsustained ventricular tachcyardia vs. nonsustained atrial tachycardia with aberration.  SVT and nonsustained atrial tachycardia.  Cardiac MRI 07/2019 IMPRESSION: 1.  Mild LV dilatation with normal systolic function (EF 32%)  2.  No late gadolinium enhancement to suggest myocardial scar  3. Mitral annular disjunction with bileaflet prolapse. Mitral regurgitation visually appears mild, but is moderate by quantification (regurgitant volume 46 cc)  4. Possible small perimembranous VSD, not well visualized on current study. Qp/Qs = 1.07, which suggests no significant shunt  5.  Normal RV size and systolic function (EF  67%)   Recent Labs: 08/13/2019: Hemoglobin 12.7; Magnesium 2.0; Platelets 210; TSH 2.610 10/12/2019: BUN 17; Creatinine, Ser 0.75; Potassium 3.6; Sodium 140   Lipid Panel No results found for: CHOL, TRIG, HDL, CHOLHDL, VLDL, LDLCALC, LDLDIRECT  Additional studies/ records that were reviewed today include:  Office notes from OB/GYN    ASSESSMENT:    1. Fatigue due to excessive exertion, initial encounter   2. MVP (mitral valve prolapse)   3. PAT (paroxysmal atrial tachycardia) ([place])   4. Wide-complex tachycardia ([place])   5. VSD (ventricular septal defect), perimembranous      PLAN:  In order of problems listed above:  1.  Exertional fatigue -noncardiac -coronary CTA showed a Ca score of 0 and no CAD  2. MVP -2D echo showed bileaflet MVP with trivial MR and mild to moderate MR by cardiac MRI -repeat echo 12/2020  3.  Atrial tachycardia - 2 week Zio Patch for palpitations showed nonsustained atrial tachycardia and NSVT -improved on BB -Continue Toprol XL 25mg  daily>>refilled today  4.  WCT -possibly atrial tach with aberration -cardiac MRI with normal LVF EF 55% with no LGE -continue BB  5.  Small perimembranous VSD -noted on 2D echo as well as MRI with no significant shunt on MRI  Followup with me in 1 year  Medication Adjustments/Labs and Tests Ordered: Current medicines are reviewed at length with the patient today.  Concerns regarding medicines are outlined above.  Medication changes, Labs and Tests ordered today are listed in the Patient Instructions below.  There are no Patient Instructions on file for this visit.   Signed, Fransico Him, MD  06/05/2020 10:39 AM    [facility] [address], [place], [place]  [address] Phone: [phone]; Fax: [phone]

## 2020-06-05 NOTE — Addendum Note (Signed)
Addended by: Antonieta Iba on: 06/05/2020 10:52 AM   Modules accepted: Orders

## 2020-07-25 ENCOUNTER — Other Ambulatory Visit: Payer: Self-pay

## 2020-07-25 ENCOUNTER — Ambulatory Visit: Payer: BC Managed Care – PPO | Attending: NURSE PRACTITIONER

## 2020-07-25 DIAGNOSIS — R52 Pain, unspecified: Secondary | ICD-10-CM | POA: Insufficient documentation

## 2020-07-25 DIAGNOSIS — R6883 Chills (without fever): Secondary | ICD-10-CM | POA: Insufficient documentation

## 2020-07-25 DIAGNOSIS — Z1152 Encounter for screening for COVID-19: Secondary | ICD-10-CM | POA: Insufficient documentation

## 2020-07-25 DIAGNOSIS — U071 COVID-19: Secondary | ICD-10-CM | POA: Insufficient documentation

## 2020-07-26 LAB — COVID-19 SCREENING - SEND-OUT: SARS-COV-2 RNA: DETECTED — AB

## 2020-08-07 DIAGNOSIS — Z79899 Other long term (current) drug therapy: Secondary | ICD-10-CM | POA: Diagnosis not present

## 2020-08-07 DIAGNOSIS — Z8639 Personal history of other endocrine, nutritional and metabolic disease: Secondary | ICD-10-CM | POA: Diagnosis not present

## 2020-08-07 DIAGNOSIS — N390 Urinary tract infection, site not specified: Secondary | ICD-10-CM | POA: Diagnosis not present

## 2020-08-07 DIAGNOSIS — E785 Hyperlipidemia, unspecified: Secondary | ICD-10-CM | POA: Diagnosis not present

## 2020-08-25 DIAGNOSIS — Z79899 Other long term (current) drug therapy: Secondary | ICD-10-CM | POA: Diagnosis not present

## 2020-08-25 DIAGNOSIS — E785 Hyperlipidemia, unspecified: Secondary | ICD-10-CM | POA: Diagnosis not present

## 2020-08-25 DIAGNOSIS — Z8639 Personal history of other endocrine, nutritional and metabolic disease: Secondary | ICD-10-CM | POA: Diagnosis not present

## 2020-08-26 DIAGNOSIS — Z8679 Personal history of other diseases of the circulatory system: Secondary | ICD-10-CM | POA: Diagnosis not present

## 2020-08-26 DIAGNOSIS — E785 Hyperlipidemia, unspecified: Secondary | ICD-10-CM | POA: Diagnosis not present

## 2020-08-26 DIAGNOSIS — I10 Essential (primary) hypertension: Secondary | ICD-10-CM | POA: Diagnosis not present

## 2020-11-07 ENCOUNTER — Telehealth (HOSPITAL_COMMUNITY): Payer: Self-pay | Admitting: Cardiology

## 2020-11-07 DIAGNOSIS — J309 Allergic rhinitis, unspecified: Secondary | ICD-10-CM | POA: Diagnosis not present

## 2020-11-07 DIAGNOSIS — J019 Acute sinusitis, unspecified: Secondary | ICD-10-CM | POA: Diagnosis not present

## 2020-11-07 DIAGNOSIS — J209 Acute bronchitis, unspecified: Secondary | ICD-10-CM | POA: Diagnosis not present

## 2020-11-07 NOTE — Telephone Encounter (Signed)
I called patient to schedule Echocardiogram in July 2022 and patient declined to schedule. Patient states that nothing is wrong and does not feel it necessary to have testing that is not necessary. Patient was somewhat frustrated and is going to send a message thru My Chart to provider.  Order will be removed from the ECHO WQ and if patient decides to have in the future we will reinstate the order. Thank you.

## 2021-01-08 DIAGNOSIS — I10 Essential (primary) hypertension: Secondary | ICD-10-CM | POA: Diagnosis not present

## 2021-01-08 DIAGNOSIS — Z8679 Personal history of other diseases of the circulatory system: Secondary | ICD-10-CM | POA: Diagnosis not present

## 2021-01-08 DIAGNOSIS — E785 Hyperlipidemia, unspecified: Secondary | ICD-10-CM | POA: Diagnosis not present

## 2021-03-15 DIAGNOSIS — J209 Acute bronchitis, unspecified: Secondary | ICD-10-CM | POA: Diagnosis not present

## 2021-03-15 DIAGNOSIS — R051 Acute cough: Secondary | ICD-10-CM | POA: Diagnosis not present

## 2021-03-15 DIAGNOSIS — M546 Pain in thoracic spine: Secondary | ICD-10-CM | POA: Diagnosis not present

## 2021-03-15 DIAGNOSIS — R5382 Chronic fatigue, unspecified: Secondary | ICD-10-CM | POA: Diagnosis not present

## 2021-05-11 ENCOUNTER — Other Ambulatory Visit (HOSPITAL_COMMUNITY): Payer: Self-pay | Admitting: GYNECOLOGICAL/ONCOLOGY

## 2021-05-11 DIAGNOSIS — Z1239 Encounter for other screening for malignant neoplasm of breast: Secondary | ICD-10-CM

## 2021-05-11 DIAGNOSIS — X58XXXA Exposure to other specified factors, initial encounter: Secondary | ICD-10-CM | POA: Diagnosis not present

## 2021-05-11 DIAGNOSIS — S62101A Fracture of unspecified carpal bone, right wrist, initial encounter for closed fracture: Secondary | ICD-10-CM | POA: Diagnosis not present

## 2021-05-11 DIAGNOSIS — S52611A Displaced fracture of right ulna styloid process, initial encounter for closed fracture: Secondary | ICD-10-CM | POA: Diagnosis not present

## 2021-05-11 DIAGNOSIS — M25531 Pain in right wrist: Secondary | ICD-10-CM | POA: Diagnosis not present

## 2021-05-11 DIAGNOSIS — S52571A Other intraarticular fracture of lower end of right radius, initial encounter for closed fracture: Secondary | ICD-10-CM | POA: Diagnosis not present

## 2021-05-11 DIAGNOSIS — S5291XA Unspecified fracture of right forearm, initial encounter for closed fracture: Secondary | ICD-10-CM | POA: Diagnosis not present

## 2021-05-13 DIAGNOSIS — M25531 Pain in right wrist: Secondary | ICD-10-CM | POA: Diagnosis not present

## 2021-05-13 DIAGNOSIS — S52501A Unspecified fracture of the lower end of right radius, initial encounter for closed fracture: Secondary | ICD-10-CM | POA: Diagnosis not present

## 2021-05-14 DIAGNOSIS — G8918 Other acute postprocedural pain: Secondary | ICD-10-CM | POA: Diagnosis not present

## 2021-05-14 DIAGNOSIS — S52571A Other intraarticular fracture of lower end of right radius, initial encounter for closed fracture: Secondary | ICD-10-CM | POA: Diagnosis not present

## 2021-05-14 DIAGNOSIS — Y999 Unspecified external cause status: Secondary | ICD-10-CM | POA: Diagnosis not present

## 2021-05-14 DIAGNOSIS — X58XXXA Exposure to other specified factors, initial encounter: Secondary | ICD-10-CM | POA: Diagnosis not present

## 2021-05-18 ENCOUNTER — Other Ambulatory Visit: Payer: Self-pay | Admitting: Obstetrics & Gynecology

## 2021-05-18 DIAGNOSIS — Z1231 Encounter for screening mammogram for malignant neoplasm of breast: Secondary | ICD-10-CM

## 2021-05-27 DIAGNOSIS — S52501A Unspecified fracture of the lower end of right radius, initial encounter for closed fracture: Secondary | ICD-10-CM | POA: Diagnosis not present

## 2021-06-08 DIAGNOSIS — Q210 Ventricular septal defect: Secondary | ICD-10-CM | POA: Diagnosis not present

## 2021-06-08 DIAGNOSIS — I341 Nonrheumatic mitral (valve) prolapse: Secondary | ICD-10-CM | POA: Diagnosis not present

## 2021-06-08 DIAGNOSIS — Z6823 Body mass index (BMI) 23.0-23.9, adult: Secondary | ICD-10-CM | POA: Diagnosis not present

## 2021-06-08 DIAGNOSIS — R002 Palpitations: Secondary | ICD-10-CM | POA: Diagnosis not present

## 2021-06-08 DIAGNOSIS — Z01419 Encounter for gynecological examination (general) (routine) without abnormal findings: Secondary | ICD-10-CM | POA: Diagnosis not present

## 2021-06-08 DIAGNOSIS — I471 Supraventricular tachycardia: Secondary | ICD-10-CM | POA: Diagnosis not present

## 2021-06-08 DIAGNOSIS — E785 Hyperlipidemia, unspecified: Secondary | ICD-10-CM | POA: Diagnosis not present

## 2021-06-16 ENCOUNTER — Other Ambulatory Visit: Payer: Self-pay

## 2021-06-16 ENCOUNTER — Encounter (HOSPITAL_BASED_OUTPATIENT_CLINIC_OR_DEPARTMENT_OTHER): Payer: Self-pay

## 2021-06-16 ENCOUNTER — Inpatient Hospital Stay
Admission: RE | Admit: 2021-06-16 | Discharge: 2021-06-16 | Disposition: A | Discharge status: Home / Self Care | Payer: BC Managed Care – PPO | Source: Ambulatory Visit | Attending: GYNECOLOGICAL/ONCOLOGY | Admitting: GYNECOLOGICAL/ONCOLOGY

## 2021-06-16 DIAGNOSIS — Z1239 Encounter for other screening for malignant neoplasm of breast: Secondary | ICD-10-CM

## 2021-06-16 DIAGNOSIS — Z1231 Encounter for screening mammogram for malignant neoplasm of breast: Secondary | ICD-10-CM | POA: Insufficient documentation

## 2021-06-17 DIAGNOSIS — I340 Nonrheumatic mitral (valve) insufficiency: Secondary | ICD-10-CM | POA: Diagnosis not present

## 2021-06-18 ENCOUNTER — Ambulatory Visit: Payer: BC Managed Care – PPO

## 2021-06-20 IMAGING — MR MR CARD MORPHOLOGY WO/W CM
45 of 48 series · 45 of 48 positions shown · IV contrast (Contrast agent)
Comparison: none

CLINICAL DATA: 54 y.o. female with history of DVT, recently
diagnosed cardiomyopathy. Echo 06/20/19 showed EF 40-45%. Also with
possible small perimembranous VSD seen on echo. Coronary CTA 07/26/19
showed no CAD

EXAM:
CARDIAC MRI
TECHNIQUE: The patient was scanned on a 1.5 Tesla Siemens magnet. A dedicated
cardiac coil was used. Functional imaging was done using Fiesta
sequences. [DATE], and 4 chamber views were done to assess for RWMA's.
Modified De Silva rule using a short axis stack was used to
calculate an ejection fraction on a dedicated work station using
Circle software. The patient received 7 cc of Gadavist. After 10
minutes inversion recovery sequences were used to assess for
infiltration and scar tissue.
CONTRAST:  7 cc  of Gadavist

[Series 4: t2_haste_db_tra_bh · axial · 8.0mm · 1.33mm/px · 1 of 16 slices shown]
[im 1/16]
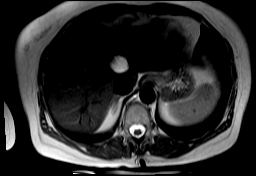

[Series 9: bSSFP · oblique · 8.0mm · 1.61mm/px · 1 of 25 slices shown (1 of 25)]
[im 1/25]
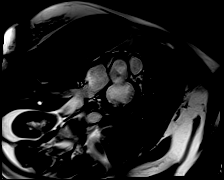

[Series 10: bSSFP · oblique · 8.0mm · 1.61mm/px · 1 of 25 slices shown (2 of 25)]
[im 1/25]
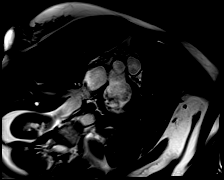

[Series 11: bSSFP · oblique · 8.0mm · 1.61mm/px · 1 of 25 slices shown (3 of 25)]
[im 1/25]
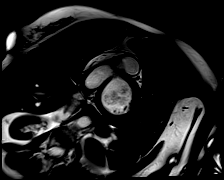

[Series 12: bSSFP · oblique · 8.0mm · 1.61mm/px · 1 of 25 slices shown (4 of 25)]
[im 1/25]
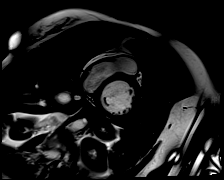

[Series 13: bSSFP · oblique · 8.0mm · 1.61mm/px · 1 of 25 slices shown (5 of 25)]
[im 1/25]
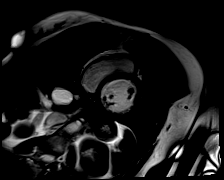

[Series 14: bSSFP · oblique · 8.0mm · 1.61mm/px · 1 of 25 slices shown (6 of 25)]
[im 1/25]
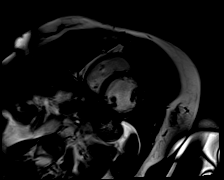

[Series 15: bSSFP · oblique · 8.0mm · 1.61mm/px · 1 of 25 slices shown (7 of 25)]
[im 1/25]
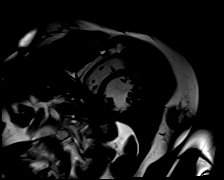

[Series 16: bSSFP · oblique · 8.0mm · 1.61mm/px · 1 of 25 slices shown (8 of 25)]
[im 1/25]
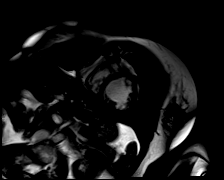

[Series 17: bSSFP · oblique · 8.0mm · 1.61mm/px · 1 of 25 slices shown (9 of 25)]
[im 1/25]
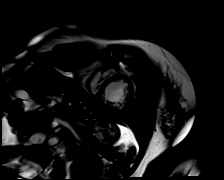

[Series 18: bSSFP · oblique · 8.0mm · 1.61mm/px · 1 of 25 slices shown (10 of 25)]
[im 1/25]
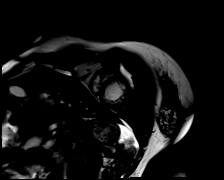

[Series 19: bSSFP · oblique · 8.0mm · 1.61mm/px · 1 of 25 slices shown (11 of 25)]
[im 1/25]
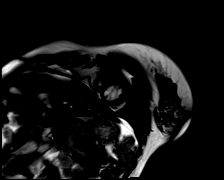

[Series 20: bSSFP · oblique · 8.0mm · 1.61mm/px · 1 of 25 slices shown (12 of 25)]
[im 1/25]
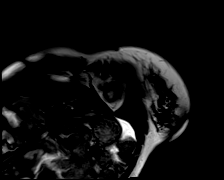

[Series 21: bSSFP · oblique · 8.0mm · 1.61mm/px · 1 of 25 slices shown (13 of 25)]
[im 1/25]
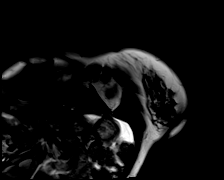

[Series 22: bSSFP · oblique · 8.0mm · 1.61mm/px · 1 of 25 slices shown (14 of 25)]
[im 1/25]
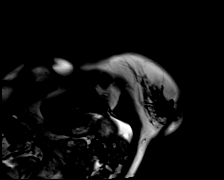

[Series 23: bSSFP · oblique · 8.0mm · 1.61mm/px · 1 of 25 slices shown (15 of 25)]
[im 1/25]
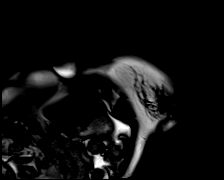

[Series 24: bSSFP · oblique · 8.0mm · 1.61mm/px · 1 of 25 slices shown (16 of 25)]
[im 1/25]
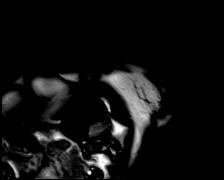

[Series 25: bSSFP · oblique · 8.0mm · 1.61mm/px · 1 of 25 slices shown (17 of 25)]
[im 1/25]
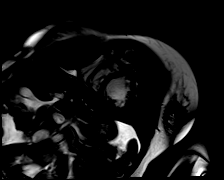

[Series 26: (id)_long_t1 · oblique · 8.0mm · 1.41mm/px · 1 of 24 slices shown]
[im 1/24]
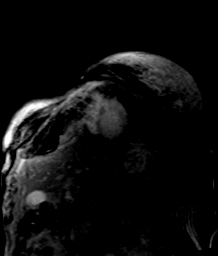

[Series 27: (id)_long_t1_moco · oblique · 8.0mm · 1.41mm/px · 1 of 24 slices shown]
[im 1/24]
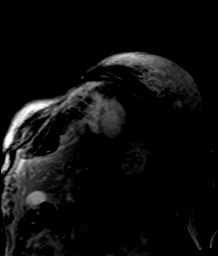

[Series 30: bSSFP · oblique · 6.0mm · 1.41mm/px · 1 of 25 slices shown (18 of 25)]
[im 1/25]
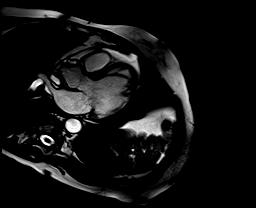

[Series 31: bSSFP · sagittal · 6.0mm · 1.41mm/px · 1 of 25 slices shown (19 of 25)]
[im 1/25]
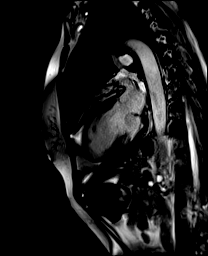

[Series 32: bSSFP · axial · 6.0mm · 1.41mm/px · 1 of 25 slices shown (20 of 25)]
[im 1/25]
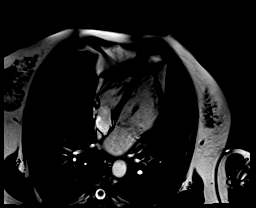

[Series 33: STIR · oblique · 8.0mm · 1.73mm/px · 1 of 17 slices shown]
[im 1/17]
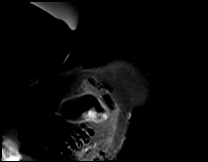

[Series 34: (id)_trufi · oblique · 8.0mm · 1.88mm/px · 1 of 9 slices shown]
[im 1/9]
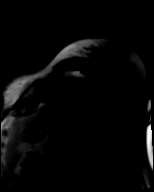

[Series 35: (id)_trufi_moco · oblique · 8.0mm · 1.88mm/px · 1 of 9 slices shown]
[im 1/9]
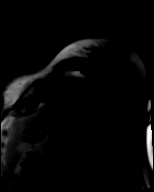

[Series 38: bSSFP · coronal · 6.0mm · 1.41mm/px · 1 of 25 slices shown (21 of 25)]
[im 1/25]
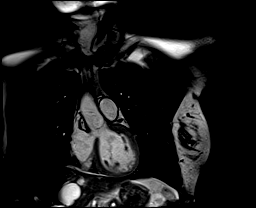

[Series 39: cine rvit · oblique · 6.0mm · 1.41mm/px · 1 of 25 slices shown]
[im 1/25]
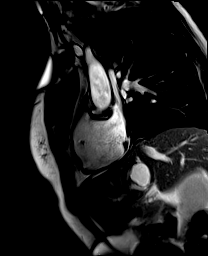

[Series 40: aortic valve cine · axial · 6.0mm · 1.41mm/px · 1 of 25 slices shown]
[im 1/25]
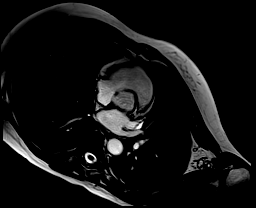

[Series 41: cine rvot · sagittal · 6.0mm · 1.41mm/px · 1 of 25 slices shown]
[im 1/25]
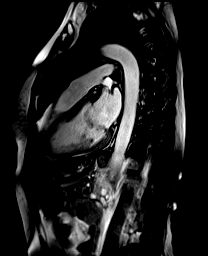

[Series 43: lge_single shot sa · oblique · 8.0mm · 1.98mm/px · 1 of 10 slices shown (1 of 2)]
[im 1/10]
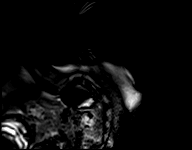

[Series 44: lge_single shot sa · oblique · 8.0mm · 1.98mm/px · 1 of 10 slices shown (2 of 2)]
[im 1/10]
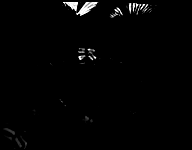

[Series 47: lge_single shot 4 · axial · 6.0mm · 1.98mm/px · 1 of 1 slices shown (1 of 2)]
[im 1/1]
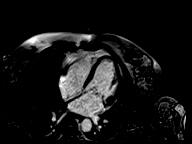

[Series 48: lge_single shot 4 · axial · 6.0mm · 1.98mm/px · 1 of 1 slices shown (2 of 2)]
[im 1/1]
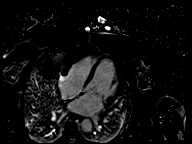

[Series 51: (id)_short_t1 · oblique · 8.0mm · 1.41mm/px · 1 of 27 slices shown]
[im 1/27]
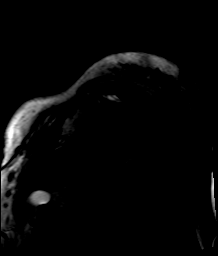

[Series 52: (id)_short_t1_moco · oblique · 8.0mm · 1.41mm/px · 1 of 27 slices shown]
[im 1/27]
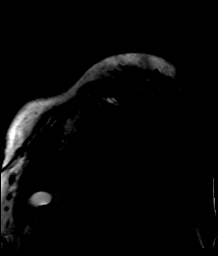

[Series 53: (id)_short_t1_moco_t1 · oblique · 8.0mm · 1.41mm/px · 1 of 6 slices shown]
[im 1/6]
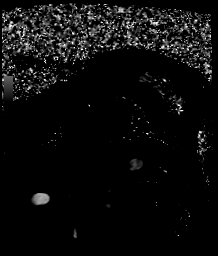

[Series 56: bSSFP · oblique · 6.0mm · 1.83mm/px · 1 of 18 slices shown (22 of 25)]
[im 1/18]
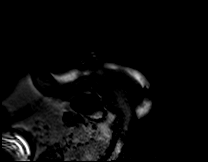

[Series 57: bSSFP · oblique · 6.0mm · 1.83mm/px · 1 of 18 slices shown (23 of 25)]
[im 1/18]
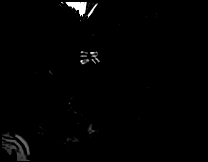

[Series 60: bSSFP · axial · 6.0mm · 1.83mm/px · 1 of 1 slices shown (24 of 25)]
[im 1/1]
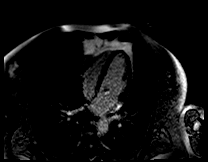

[Series 61: bSSFP · axial · 6.0mm · 1.83mm/px · 1 of 1 slices shown (25 of 25)]
[im 1/1]
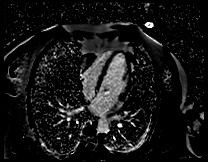

[Series 64: (id) av tp_retro_bh · axial · 6.0mm · 1.73mm/px · 1 of 30 slices shown (1 of 2)]
[im 1/30]
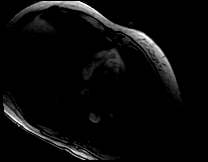

[Series 65: (id) av tp_retro_bh_mag · axial · 6.0mm · 1.73mm/px · 1 of 28 slices shown]
[im 1/28]
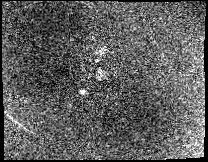

[Series 66: (id) av tp_retro_bh_p · axial · 6.0mm · 1.73mm/px · 1 of 30 slices shown]
[im 1/30]
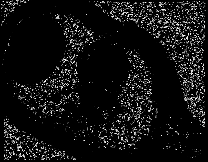

[Series 67: (id) av tp_retro_bh · axial · 6.0mm · 1.73mm/px · 1 of 30 slices shown (2 of 2)]
[im 1/30]
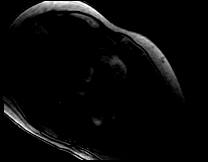

[45 of 48 positions shown; findings below may reference images not displayed]

FINDINGS: Left ventricle:

-Mild dilatation

-Normal systolic function

-Normal ECV (27%)

-No LGE

LV EF:  55% (Normal 56-78%)

Absolute volumes:

LV EDV: 166mL (Normal 52-141 mL)

LV ESV: 74mL (Normal 13-51 mL)

LV SV: 92mL (Normal 33-97 mL)

CO: 5.2L/min (Normal 2.7-6.0 L/min)

Indexed volumes:

LV EDV: 95mL/sq-m (Normal 41-81 mL/sq-m)

LV ESV: 42mL/sq-m (Normal 12-21 mL/sq-m)

LV SV: 52mL/sq-m (Normal 26-56 mL/sq-m)

CI: 3.0L/min/sq-m (Normal 1.8-3.8 L/min/sq-m)

Right ventricle: Normal RV size and systolic function

RV EF: 50% (Normal 47-80%)

Absolute volumes:

RV EDV: 98mL (Normal 58-154 mL)

RV ESV: 49mL (Normal 12-68 mL)

RV SV: 49mL (Normal 35-98 mL)

CO: 2.8L/min (Normal 2.7-6 L/min)

Indexed volumes:

RV EDV: 56mL/sq-m (Normal 48-87 mL/sq-m)

RV ESV: 28mL/sq-m (Normal 11-28 mL/sq-m)

RV SV: 28mL/sq-m (Normal 27-57 mL/sq-m)

CI: 1.6L/min/sq-m (Normal 1.8-3.8 L/min/sq-m)

Left atrium: Normal size

Right atrium: Normal size

Mitral valve: Mitral annular disjunction with bileaflet prolapse.
Mitral regurgitation visually appears mild, but is moderate by
quantification (regurgitant volume 46 cc)

Aortic valve: No regurgitation

Tricuspid valve: No regurgitation

Pulmonic valve: No regurgitation

Aorta: Normal proximal ascending aorta

Pericardium: Normal
IMPRESSION: 1.  Mild LV dilatation with normal systolic function (EF 55%)

2.  No late gadolinium enhancement to suggest myocardial scar

3. Mitral annular disjunction with bileaflet prolapse. Mitral
regurgitation visually appears mild, but is moderate by
quantification (regurgitant volume 46 cc)

4. Possible small perimembranous VSD, not well visualized on current
study. Qp/Qs = 1.07, which suggests no significant shunt

5.  Normal RV size and systolic function (EF 50%)

## 2021-06-28 ENCOUNTER — Other Ambulatory Visit: Payer: Self-pay | Admitting: Cardiology

## 2021-07-15 ENCOUNTER — Other Ambulatory Visit (HOSPITAL_COMMUNITY): Payer: Self-pay | Admitting: NURSE PRACTITIONER

## 2021-07-15 DIAGNOSIS — R001 Bradycardia, unspecified: Secondary | ICD-10-CM

## 2021-07-15 DIAGNOSIS — I10 Essential (primary) hypertension: Secondary | ICD-10-CM

## 2021-07-15 DIAGNOSIS — R079 Chest pain, unspecified: Secondary | ICD-10-CM

## 2021-08-27 DIAGNOSIS — I10 Essential (primary) hypertension: Secondary | ICD-10-CM | POA: Insufficient documentation

## 2021-08-27 DIAGNOSIS — R079 Chest pain, unspecified: Secondary | ICD-10-CM | POA: Insufficient documentation

## 2021-09-01 ENCOUNTER — Encounter (INDEPENDENT_AMBULATORY_CARE_PROVIDER_SITE_OTHER): Payer: Self-pay | Admitting: Internal Medicine

## 2021-09-01 ENCOUNTER — Ambulatory Visit: Payer: BC Managed Care – PPO | Attending: NURSE PRACTITIONER | Admitting: Internal Medicine

## 2021-09-01 ENCOUNTER — Other Ambulatory Visit: Payer: Self-pay

## 2021-09-01 VITALS — BP 134/89 | HR 76 | Ht 69.0 in | Wt 185.0 lb

## 2021-09-01 DIAGNOSIS — R079 Chest pain, unspecified: Secondary | ICD-10-CM

## 2021-09-01 DIAGNOSIS — R001 Bradycardia, unspecified: Secondary | ICD-10-CM

## 2021-09-01 DIAGNOSIS — R002 Palpitations: Secondary | ICD-10-CM

## 2021-09-01 DIAGNOSIS — I10 Essential (primary) hypertension: Secondary | ICD-10-CM

## 2021-09-01 NOTE — Progress Notes (Signed)
[address] [facility] [address]  [place] [address]  [phone]  New Patient Visit Note    Date:   09/01/2021  Name: Suzanne Olson  Age: 56 y.o.  PCP: Sandie Ano, MD  [address]  [place] [id number]      Chief Complaint: New Patient (HTN)    History of Present Illness  Suzanne Olson is a 56 y.o. female who is brought in today for evaluation of palpitations and left neck discomfort.  She has a history of hypertension and hypothyroidism.  She has had chronic palpitations however over the last 6 months they have been much more progressive.  Symptoms last several minutes and are not associated with presyncope or syncope.  They are not precipitated by anything in particular.  They can occur at rest and not necessary precipitated by exertion.  They can occur every several days.  The patient's paternal grandfather died suddenly at the age of 73.  In addition, she notes neck discomfort the last a split second over the left trapezius muscle.  It is not exertion related.  It is not related to movement of the upper extremity or by lifting heavy items.   She does have back pain for which she takes a nonsteroidal.  Her back pain seems to be musculoskeletal from work.  He has no symptoms that are suggestive of classic angina PND or orthopnea her stroke.  She did have blood work performed recently but results are not available to our office.  She is now being seen for further recommendations.    Patient's physical exam is normal.  EKG today reveals sinus rhythm med is otherwise normal.    Assessment plan:  1. Palpitations.  We will pursue a 7 day event monitor to define any a arrhythmias.  No need to pursue an echocardiogram at this time.  Her cardiovascular exam is normal.    2. Left neck pain is not angina.  Musculoskeletal versus nerve pain.  Workup per primary service.  Her neck pain is infrequent.    Tentatively follow up with me in 4 weeks.      Current Outpatient Medications    Medication Sig   . atenoloL (TENORMIN) 25 mg Oral Tablet 2 Tablets (50 mg total) Once a day   . busPIRone (BUSPAR) 7.5 mg Oral Tablet Take 1 Tablet (7.5 mg total) by mouth Twice daily   . levothyroxine (SYNTHROID) 75 mcg Oral Tablet 1 Tablet (75 mcg total) Once a day   . meloxicam (MOBIC) 15 mg Oral Tablet Take 1 Tablet (15 mg total) by mouth Once a day   . olmesartan (BENICAR) 40 mg Oral Tablet Take 1 Tablet (40 mg total) by mouth Once a day     No Known Allergies  Past Medical History:   Diagnosis Date   . Cancer (CMS [facility])          Past Surgical History:   Procedure Laterality Date   . HX BREAST BIOPSY Left          Family Medical History:     Problem Relation (Age of Onset)    Atrial fibrillation Mother    Breast Cancer Paternal Aunt, Paternal 31, Paternal 80, Paternal 60, Paternal cousin          Social History     Socioeconomic History   . Marital status: Married   Tobacco Use   . Smoking status: Former     Types: Cigarettes   .  Smokeless tobacco: Never   Substance and Sexual Activity   . Alcohol use: Not Currently   . Drug use: Not Currently       Review of Systems  The 12 point review system is negative with the exception of positive findings mentioned above.        Physical Exam  BP 134/89   Pulse 76   Ht 1.753 m ('5\' 9"'$ )   Wt 83.9 kg (185 lb)   SpO2 98%   BMI 27.32 kg/m       GEN:  Comfortable, not in distress.  HEENT: at/nc, mucous membranes are moist and hearing is intact  Neck: supple without mass, JVP is normal no bruits or pulses are normal.  Lungs:  Clear to auscultation.  Heart:  Regular rhythm normal S1 and S2.  No gallops no murmurs PMI not displaced no thrills   ABD: soft, non-tender, positive bowel sounds, no masses nor bruits, no hepatosplenomegaly  Extremity:  No edema no clubbing full range of motion  NEURO:  No focal deficits  Psychiatric:  Normal affect  SKIN: warm and dry, no rashes or lesions    Labs:   No results found for: HA1C, MICALBCREAT No results found for: LDLCHOL,  TRIG, HDLCHOL, CHOLESTEROL   Lab Results   Component Value Date    SODIUM 140 05/01/2020    POTASSIUM 4.1 05/01/2020    CHLORIDE 103 05/01/2020    CO2 29 05/01/2020    BUN 17 05/01/2020    CREATININE 0.75 (L) 05/01/2020    GFR >60 05/01/2020    CALCIUM 9.6 05/01/2020     Lab Results   Component Value Date    WBC 5.6 05/01/2020    HGB 13.8 05/01/2020    HCT 44.2 05/01/2020     I have reviewed the above labs   Assessment and Plan:     ICD-10-CM    1. Hypertension, unspecified type  I10 EKG - In Clinic, Same Day (93000)      2. Chest pain, unspecified type  R07.9 EKG - In Clinic, Same Day (93000)      3. Bradycardia  R00.1 EKG - In Clinic, Same Day (93000)      4. Palpitations  R00.2 7 DAY EXTENDED HOLTER MONITOR          Orders Placed This Encounter   . EKG - In Clinic, Same Day (93[address])   . Hector, MD  [facility]     This note may have been partially generated using MModal Fluency Direct system, and there may be some incorrect words, spellings, and punctuation that were not noted in checking the note before saving.

## 2021-09-02 LAB — ECG W INTERP (AMB USE ONLY)(MUSE,IN CLINIC)
Atrial Rate: 64 {beats}/min
Calculated P Axis: 38 degrees
Calculated R Axis: 58 degrees
Calculated T Axis: 68 degrees
PR Interval: 170 ms
QRS Duration: 96 ms
QT Interval: 396 ms
QTC Calculation: 408 ms
Ventricular rate: 64 {beats}/min

## 2021-09-11 ENCOUNTER — Ambulatory Visit (HOSPITAL_COMMUNITY): Payer: Self-pay

## 2021-09-14 ENCOUNTER — Inpatient Hospital Stay
Admission: RE | Admit: 2021-09-14 | Discharge: 2021-09-14 | Disposition: A | Discharge status: Home / Self Care | Payer: BC Managed Care – PPO | Source: Ambulatory Visit | Attending: Internal Medicine | Admitting: Internal Medicine

## 2021-09-14 ENCOUNTER — Other Ambulatory Visit: Payer: Self-pay

## 2021-09-14 DIAGNOSIS — R002 Palpitations: Secondary | ICD-10-CM | POA: Insufficient documentation

## 2021-09-26 LAB — 7 DAY EXTENDED HOLTER MONITOR
Heart rate (average): 69 {beats}/min
Isolated VE Counts: 46 episodes
Longest supraventricular tachycardia episode - duration: 8.5 s
Longest supraventricular tachycardia episode - heart rate (: 120 {beats}/min
Longest supraventricular tachycardia episode - number of be: 17 beats
SVE Couplets Counts: 69 episodes
SVE Triplets Counts: 26 episodes
Supraventricular tachycardia - number of episodes: 21
Supraventricular tachycardia with fastest heart rate - dura: 2 s
Supraventricular tachycardia with fastest heart rate - hear: 176 {beats}/min
Supraventricular tachycardia with fastest heart rate - numb: 6 beats

## 2021-10-03 DIAGNOSIS — R002 Palpitations: Secondary | ICD-10-CM

## 2021-10-03 LAB — 7 DAY EXTENDED HOLTER MONITOR
Isolated SVE count: 1174 episodes
Supraventricular tachycardia - heart rate (average): 140 {beats}/min

## 2021-10-13 ENCOUNTER — Ambulatory Visit: Payer: BC Managed Care – PPO | Attending: Family | Admitting: Family

## 2021-10-13 ENCOUNTER — Encounter (INDEPENDENT_AMBULATORY_CARE_PROVIDER_SITE_OTHER): Payer: Self-pay | Admitting: Family

## 2021-10-13 ENCOUNTER — Other Ambulatory Visit: Payer: Self-pay

## 2021-10-13 VITALS — BP 156/84 | HR 70 | Ht 69.0 in | Wt 190.0 lb

## 2021-10-13 DIAGNOSIS — R002 Palpitations: Secondary | ICD-10-CM

## 2021-10-13 DIAGNOSIS — R0789 Other chest pain: Secondary | ICD-10-CM

## 2021-10-13 DIAGNOSIS — I10 Essential (primary) hypertension: Secondary | ICD-10-CM

## 2021-10-13 LAB — ECG W INTERP (AMB USE ONLY)(MUSE,IN CLINIC)
Atrial Rate: 62 {beats}/min
Calculated P Axis: 49 degrees
Calculated R Axis: 60 degrees
Calculated T Axis: 72 degrees
PR Interval: 170 ms
QRS Duration: 100 ms
QT Interval: 404 ms
QTC Calculation: 410 ms
Ventricular rate: 62 {beats}/min

## 2021-10-13 MED ORDER — ATENOLOL 100 MG TABLET
100.0000 mg | ORAL_TABLET | Freq: Every day | ORAL | 4 refills | Status: AC
Start: 2021-10-13 — End: ?

## 2021-10-13 NOTE — Progress Notes (Signed)
Suzanne Olson is a 56 y.o. female seen in the office on 10/13/2021    Chief Complaint   Patient presents with   . Follow Up     F/u after testing 7 day event          Patient Active Problem List   Diagnosis   . Hypertension   . Chest pain   . Palpitations       History of Present Illness: Suzanne Olson is a 56 y.o. female who is brought in today for evaluation of palpitations and left neck discomfort.  She has a history of hypertension and hypothyroidism.  She has had chronic palpitations however over the last 6 months they have been much more progressive.  Symptoms last several minutes and are not associated with presyncope or syncope.  They are not precipitated by anything in particular.  They can occur at rest and are not necessarily precipitated by exertion.  They can occur every several days.  The patient's paternal grandfather died suddenly at the age of 22.  In addition, she notes neck discomfort that will last a split second over the left trapezius muscle.  It is not exertion related.  It is not related to movement of the upper extremity or by lifting heavy items.   She does have back pain for which she takes a nonsteroidal.  Her back pain seems to be musculoskeletal from work.  She has no symptoms that are suggestive of classic angina PND or orthopnea her stroke.  She did have blood work performed recently but results are not available to our office.  She is now being seen for further recommendations.    Seven day event monitor after last office visit showed a predominant underlying rhythm sinus.  Twenty-one supraventricular tachycardia episodes occurred.  The run with the fastest interval lasted 6 beats with a maximum heart rate of 193 beats per minute.  The longest lasting 17 beats with an average heart rate of 120 beats per minute.  No ventricular tachycardia noted.  No significant pauses or episodes of high-degree AV block.  No atrial fibrillation was found.    She presents the office for short-term  follow-up.  She presents today for the most part feeling well.  She denies chest pain, shortness of breath, dizziness, near-syncope or syncope.  She does continue to have palpitations bladder somewhat bothersome.  She currently is taking the atenolol 2 tablets in the morning 1 tablet in the evening.  She does tell me sometimes she forgets the evening medication.  Her blood pressure is slightly elevated today.    Current Outpatient Medications   Medication Sig   . atenoloL (TENORMIN) 100 mg Oral Tablet Take 1 Tablet (100 mg total) by mouth Once a day   . busPIRone (BUSPAR) 7.5 mg Oral Tablet Take 1 Tablet (7.5 mg total) by mouth Twice daily   . levothyroxine (SYNTHROID) 75 mcg Oral Tablet 1 Tablet (75 mcg total) Once a day   . meloxicam (MOBIC) 15 mg Oral Tablet Take 1 Tablet (15 mg total) by mouth Once a day   . olmesartan (BENICAR) 40 mg Oral Tablet Take 1 Tablet (40 mg total) by mouth Once a day       No Known Allergies  Family Medical History:     Problem Relation (Age of Onset)    Atrial fibrillation Mother    Breast Cancer Paternal Aunt, Paternal 30, Paternal 47, Paternal Elenor [place], Paternal cousin  Social History     Socioeconomic History   . Marital status: Married   Tobacco Use   . Smoking status: Former     Types: Cigarettes   . Smokeless tobacco: Never   Substance and Sexual Activity   . Alcohol use: Not Currently   . Drug use: Not Currently         Physical Exam:  BP (!) 156/84   Pulse 70   Ht 1.753 m ('5\' 9"'$ )   Wt 86.2 kg (190 lb)   SpO2 96%   BMI 28.06 kg/m       Body mass index is 28.06 kg/m.  Wt Readings from Last 5 Encounters:   10/13/21 86.2 kg (190 lb)   09/01/21 83.9 kg (185 lb)     The patient is in no distress. Skin is pink, warm and dry. There is no neck vein distention. No carotid bruits heard. The lungs are clear bilaterally. The heart is regular without any significant murmur, gallop,rub,or click. The abdomen is soft and nontender.  Bowel sounds positive.  The aorta is not  palpable. Extremities show no significant edema.    PERTINENT DIAGNOSTICS:  EKG today showed normal sinus rhythm.    IMPRESSION:  Assessment/Plan   1. Palpitations    2. Primary hypertension    3. Other chest pain          PLAN:  This patient remains stable from a cardiac standpoint.  She continues to have palpitations with documented SVT on event monitor.  At this time I will increase her atenolol to 100 mg daily.  She is agreeable to this.  I will have her return to our office in 6 months for follow-up or sooner if needed.  I did ask that she contact our office if she developed any dizziness, near-syncope or syncope after increasing her medication.  She did agree to do so.  This note will be forwarded to Dr. Zoe Lan for review. He was readily available for discussion in regard to this patient.     Orders Placed This Encounter   . EKG - In Clinic, Same Day (93[address])   . atenoloL (TENORMIN) 100 mg Oral Tablet

## 2021-10-30 ENCOUNTER — Encounter: Payer: Self-pay | Admitting: Internal Medicine

## 2021-10-30 ENCOUNTER — Ambulatory Visit (INDEPENDENT_AMBULATORY_CARE_PROVIDER_SITE_OTHER): Payer: BC Managed Care – PPO | Admitting: Internal Medicine

## 2021-10-30 VITALS — BP 120/70 | HR 74 | Ht 66.0 in | Wt 147.8 lb

## 2021-10-30 DIAGNOSIS — E210 Primary hyperparathyroidism: Secondary | ICD-10-CM

## 2021-10-30 DIAGNOSIS — E162 Hypoglycemia, unspecified: Secondary | ICD-10-CM

## 2021-10-30 NOTE — Patient Instructions (Signed)
-   Stay Hydrated  ?- Avoid over the counter calcium  ?- Consume 2-3 servings of dietary calcium a day  ? ? ?ReliON glucose meter,  check sugar fasting and when symptomatic  ? ? ? ? ?24-Hour Urine Collection ? ?You will be collecting your urine for a 24-hour period of time. ?Your timer starts with your first urine of the morning (For example - If you first pee at [place], your timer will start at [place]) ?Throw away your first urine of the morning ?Collect your urine every time you pee for the next 24 hours ?STOP your urine collection 24 hours after you started the collection (For example - You would stop at 9AM the day after you started) ? ?

## 2021-10-30 NOTE — Progress Notes (Signed)
? ? ?Name: Jessica Hanson  ?MRN/ DOB: [id number], 1966/02/19    ?Age/ Sex: 56 y.o., female   ? ?PCP: Nicholos Johns, MD   ?Reason for Endocrinology Evaluation: Hypercalcemia  ?   ?Date of Initial Endocrinology Evaluation: 09/30/2019  ? ? ?HPI: ?Jessica Hanson is a 56 y.o. female with a past medical history of HTN and Dyslipidemia. The patient presented for initial endocrinology clinic visit on 10/10/2019 for consultative assistance with her hypercalcemia .  ? ?Jessica Hanson that she was first diagnosed with hypercalcemia in 2019. With a max level of 13.2 mg/dL ( uncorrected in 2021)and elevated PTH at 107 pg/mL  ? ? ?24-hr urine calcium 258 mg 09/2019 ? ?She has history of kidney stones- years ago ?She denies osteoporosis or prior fractures.  ?She denies  family history of osteoporosis, parathyroid disease, thyroid disease.  ? ? ? ?SUBJECTIVE:  ? ? ? ?Today (10/30/21):  Jessica Hanson for a follow up on Hypercalcemia. She has NOT been to our clinic in 2 years.  ? ? ?She drinks water all the time, does not endorse polydipsia  ?Denies constipation  ?Denies renal stones since last visit  ?She gets hot sweaty and fainting sensation  ?She has weakness in the legs  ? ?She is Hanson today to discuss hypoglycemia , she describes episode of confusion and paralysis that are relieved by drinking juice or eating something. ?No gastric sx in the past  ?Pt has a sister with T1DM but the pt is  hypoglycemic, this has been  ? ?She consumes 2 servings of dietary calcium  ? ? ? ? ? ?HISTORY:  ?Past Medical History:  ?Past Medical History:  ?Diagnosis Date  ? DVT (deep venous thrombosis) ([place])   ? Hypoglycemia   ? MVP (mitral valve prolapse)   ? bileaflet MVP and mild to moderate MR on cardiac MRI  ? NICM (nonischemic cardiomyopathy) ([place])   ? EF reduced on echo but normal on cardiac MRI with no LGE - EF 55%  ? Ovarian cyst   ? PAT (paroxysmal atrial tachycardia) ([facility])   ? PVC's (premature ventricular  contractions)   ? SVT (supraventricular tachycardia) ([place])   ? Venous hypertension   ? VSD (ventricular septal defect), perimembranous   ? very small on cardiac MRI 12/2019  ? Weakness of lower extremity   ? Wide-complex tachycardia ([place])   ? a. event monitor 06/2019 - possible NSVT vs AT with aberration  ? ?Past Surgical History:  ?Past Surgical History:  ?Procedure Laterality Date  ? ENDOVENOUS ABLATION SAPHENOUS VEIN W/ LASER    ?  ?Social History:  reports that she has never smoked. She has never used smokeless tobacco. She reports current alcohol use. She reports that she does not use drugs. ?Family History: family history includes Varicose Veins in her maternal grandmother and sister. ? ? ?HOME MEDICATIONS: ?Allergies as of 10/30/2021   ? ?   Reactions  ? Hydrocortisone   ? Oxycodone Nausea Only  ? Sulfa Antibiotics Other (See Comments)  ? hallucinations  ? ?  ? ?  ?Medication List  ?  ? ?  ? Accurate as of Oct 30, 2021 12:10 PM. If you have any questions, ask your nurse or doctor.  ?  ?  ? ?  ? ?aspirin EC 81 MG tablet ?Take 81 mg by mouth daily. ?  ?diphenhydrAMINE 25 MG tablet ?Commonly known as: BENADRYL ?Take 25 mg by mouth every 6 (six) hours as  needed for allergies. ?  ?Fish Oil Triple Strength 1400 MG Caps ?Take by mouth as directed. ?  ?melatonin 5 MG Tabs ?Take by mouth at bedtime. ?  ?metoprolol succinate 25 MG 24 hr tablet ?Commonly known as: TOPROL-XL ?Take 1 tablet (25 mg total) by mouth daily. Pt. needs to make an overdue appt. with Cardiologist in order to receive future refills. Thank You. 1st Attempt. ?  ?OVER THE COUNTER MEDICATION ?Prevogen improve memory ?  ?pyridOXINE 100 MG tablet ?Commonly known as: VITAMIN B-6 ?Take 100 mg by mouth daily. ?  ?Turmeric 450 MG Caps ?Take by mouth as directed. ?  ?vitamin B-12 1000 MCG tablet ?Commonly known as: CYANOCOBALAMIN ?Take 1,000 mcg by mouth daily. ?  ?Vitamin C 500 MG Chew ?Chew 500 mg by mouth 2 (two) times daily. ?  ?zinc gluconate 50 MG  tablet ?Take 50 mg by mouth daily. ?  ? ?  ?  ? ? ?REVIEW OF SYSTEMS: ?A comprehensive ROS was conducted with the patient and is negative except as per HPI  ? ? ? ?OBJECTIVE:  ?VS: BP 120/70 (BP Location: Left Arm, Patient Position: Sitting, Cuff Size: Small)   Pulse 74   Ht '5\' 6"'$  (1.676 m)   Wt 147 lb 12.8 oz (67 kg)   SpO2 98%   BMI 23.86 kg/m?  ? ? ?Wt Readings from Last 3 Encounters:  ?06/05/20 144 lb 4.8 oz (65.5 kg)  ?10/10/19 144 lb 9.6 oz (65.6 kg)  ?08/13/19 148 lb (67.1 kg)  ? ? ? ?EXAM: ?General: Pt appears well and is in NAD  ?Neck: General: Supple without adenopathy. ?Thyroid: Thyroid size normal.  No goiter or nodules appreciated. No thyroid bruit.  ?Lungs: Clear with good BS bilat with no rales, rhonchi, or wheezes  ?Heart: Auscultation: RRR.  ?Abdomen: Normoactive bowel sounds, soft, nontender, without masses or organomegaly palpable  ?Extremities:  ?BL LE: No pretibial edema .  ?Mental Status: Judgment, insight: Intact ?Orientation: Oriented to time, place, and person ?Mood and affect: No depression, anxiety, or agitation  ? ? ? ?DATA REVIEWED: ? ? Latest Reference Range & Units 10/30/21 15:18  ?Sodium 135 - 146 mmol/L 139  ?Potassium 3.5 - 5.3 mmol/L 4.5  ?Chloride 98 - 110 mmol/L 104  ?CO2 20 - 32 mmol/L 24  ?Glucose 65 - 99 mg/dL 107 (H)  ?BUN 7 - 25 mg/dL 18  ?Creatinine 0.50 - 1.03 mg/dL 0.84  ?Calcium 8.6 - 10.4 mg/dL 12.7 (H)  ?BUN/Creatinine Ratio 6 - 22 (calc) NOT APPLICABLE  ?Calcium Ionized 4.7 - 5.5 mg/dL 6.5 (H)  ?AG Ratio 1.0 - 2.5 (calc) 1.8  ?AST 10 - 35 U/L 30  ?ALT 6 - 29 U/L 34 (H)  ?Total Protein 6.1 - 8.1 g/dL 7.2  ?Total Bilirubin 0.2 - 1.2 mg/dL 0.4  ?Alkaline phosphatase (APISO) 37 - 153 U/L 156 (H)  ?Vitamin D, 25-Hydroxy 30 - 100 ng/mL 55  ?Globulin 1.9 - 3.7 g/dL (calc) 2.6  ? ? ?ASSESSMENT/PLAN/RECOMMENDATIONS:  ? ?Primary Hyperparathyroidism: ? ?-Patient was lost to follow-up for the past 2 years ?-Corrected serum calcium 12.22 mg/DL ionized calcium 6.5 mg/DL,  patient MEETS surgical criteria for parathyroidectomy due to elevated serum calcium ? ?-We will proceed with thyroid ultrasound ?-Patient will also have DEXA scan ordered through her PCP, emphasized importance of including distal radius ?-We will repeat 24-hour urinary collection for calcium excretion ?-Patient in agreement for surgical referral ? ? ? ? ? ?Recommendations : ?- Encouraged hydration  ?- AVOID CALCIUM SUPPLEMENTS,  AVOID LOW CALCIUM DIET ?- Maintain normal dietary calcium intake (2-3 servings of dairy a day) ? ? ? ? ? ?2. Subjective Hypoglycemia: ? ?-I explained to the patient there is no evidence of hypoglycemia on serum glucose ?-I have advised the patient to obtain over-the-counter glucose meter and start checking glucose fasting and when symptomatic ?-Patient to notify our office if BG's <60 mg/DL ? ? ? ? ?Addendum: Discussed lab results with the patient and the need for surgical intervention given serum calcium 12.7 mg/DL ? ? ?Signed electronically by: ?Abby Nena Jordan, MD ? ?[facility]  [facility] ?[address] ?[place], [place] [address] ?Phone: [phone] ?FAX: [phone] ? ? ?CC: ?Nicholos Johns, MD ?[place] [address] ?Phone: [phone] ?Fax: [phone] ? ? ?Return to Endocrinology clinic as below: ?Future Appointments  ?Date Time Provider [place]  ?10/30/2021  2:40 PM Tuwanna Krausz, Melanie Crazier, MD [facility] None  ?  ? ? ? ? ? ?

## 2021-11-01 LAB — COMPREHENSIVE METABOLIC PANEL
AG Ratio: 1.8 (calc) (ref 1.0–2.5)
ALT: 34 U/L — ABNORMAL HIGH (ref 6–29)
AST: 30 U/L (ref 10–35)
Albumin: 4.6 g/dL (ref 3.6–5.1)
Alkaline phosphatase (APISO): 156 U/L — ABNORMAL HIGH (ref 37–153)
BUN: 18 mg/dL (ref 7–25)
CO2: 24 mmol/L (ref 20–32)
Calcium: 12.7 mg/dL — ABNORMAL HIGH (ref 8.6–10.4)
Chloride: 104 mmol/L (ref 98–110)
Creat: 0.84 mg/dL (ref 0.50–1.03)
Globulin: 2.6 g/dL (calc) (ref 1.9–3.7)
Glucose, Bld: 107 mg/dL — ABNORMAL HIGH (ref 65–99)
Potassium: 4.5 mmol/L (ref 3.5–5.3)
Sodium: 139 mmol/L (ref 135–146)
Total Bilirubin: 0.4 mg/dL (ref 0.2–1.2)
Total Protein: 7.2 g/dL (ref 6.1–8.1)

## 2021-11-01 LAB — PARATHYROID HORMONE, INTACT (NO CA): PTH: 214 pg/mL — ABNORMAL HIGH (ref 16–77)

## 2021-11-01 LAB — VITAMIN D 25 HYDROXY (VIT D DEFICIENCY, FRACTURES): Vit D, 25-Hydroxy: 55 ng/mL (ref 30–100)

## 2021-11-01 LAB — CALCIUM, IONIZED: Calcium, Ion: 6.5 mg/dL — ABNORMAL HIGH (ref 4.7–5.5)

## 2021-11-02 DIAGNOSIS — I10 Essential (primary) hypertension: Secondary | ICD-10-CM | POA: Diagnosis not present

## 2021-11-02 DIAGNOSIS — Z8679 Personal history of other diseases of the circulatory system: Secondary | ICD-10-CM | POA: Diagnosis not present

## 2021-11-02 DIAGNOSIS — E785 Hyperlipidemia, unspecified: Secondary | ICD-10-CM | POA: Diagnosis not present

## 2021-11-03 DIAGNOSIS — E210 Primary hyperparathyroidism: Secondary | ICD-10-CM | POA: Insufficient documentation

## 2021-11-04 ENCOUNTER — Other Ambulatory Visit: Payer: BC Managed Care – PPO

## 2021-11-04 DIAGNOSIS — E210 Primary hyperparathyroidism: Secondary | ICD-10-CM | POA: Diagnosis not present

## 2021-11-04 NOTE — Progress Notes (Unsigned)
Total volume 3,600.  Started 11-03-2021 at 8:30 am. Ended 11-04-2021 at 8:02 am. ?

## 2021-11-05 LAB — CALCIUM, URINE, 24 HOUR: Calcium, 24H Urine: 374 mg/24 h — ABNORMAL HIGH

## 2021-11-05 LAB — CREATININE, URINE, 24 HOUR: Creatinine, 24H Ur: 1.15 g/(24.h) (ref 0.50–2.15)

## 2021-11-11 DIAGNOSIS — Z79899 Other long term (current) drug therapy: Secondary | ICD-10-CM | POA: Diagnosis not present

## 2021-11-11 DIAGNOSIS — E559 Vitamin D deficiency, unspecified: Secondary | ICD-10-CM | POA: Diagnosis not present

## 2021-11-11 DIAGNOSIS — E785 Hyperlipidemia, unspecified: Secondary | ICD-10-CM | POA: Diagnosis not present

## 2021-11-12 DIAGNOSIS — I872 Venous insufficiency (chronic) (peripheral): Secondary | ICD-10-CM | POA: Diagnosis not present

## 2021-11-26 DIAGNOSIS — E2839 Other primary ovarian failure: Secondary | ICD-10-CM | POA: Diagnosis not present

## 2021-11-26 DIAGNOSIS — E210 Primary hyperparathyroidism: Secondary | ICD-10-CM | POA: Diagnosis not present

## 2021-11-26 DIAGNOSIS — M810 Age-related osteoporosis without current pathological fracture: Secondary | ICD-10-CM | POA: Diagnosis not present

## 2021-11-30 DIAGNOSIS — L578 Other skin changes due to chronic exposure to nonionizing radiation: Secondary | ICD-10-CM | POA: Diagnosis not present

## 2021-11-30 DIAGNOSIS — L821 Other seborrheic keratosis: Secondary | ICD-10-CM | POA: Diagnosis not present

## 2021-11-30 DIAGNOSIS — L728 Other follicular cysts of the skin and subcutaneous tissue: Secondary | ICD-10-CM | POA: Diagnosis not present

## 2021-11-30 DIAGNOSIS — D225 Melanocytic nevi of trunk: Secondary | ICD-10-CM | POA: Diagnosis not present

## 2021-12-07 ENCOUNTER — Ambulatory Visit
Admission: RE | Admit: 2021-12-07 | Discharge: 2021-12-07 | Disposition: A | Discharge status: Home / Self Care | Payer: BC Managed Care – PPO | Source: Ambulatory Visit | Attending: Internal Medicine | Admitting: Internal Medicine

## 2021-12-07 DIAGNOSIS — E210 Primary hyperparathyroidism: Secondary | ICD-10-CM

## 2021-12-07 DIAGNOSIS — E042 Nontoxic multinodular goiter: Secondary | ICD-10-CM | POA: Diagnosis not present

## 2021-12-07 DIAGNOSIS — E213 Hyperparathyroidism, unspecified: Secondary | ICD-10-CM | POA: Diagnosis not present

## 2021-12-14 DIAGNOSIS — E210 Primary hyperparathyroidism: Secondary | ICD-10-CM | POA: Diagnosis not present

## 2021-12-14 DIAGNOSIS — E213 Hyperparathyroidism, unspecified: Secondary | ICD-10-CM | POA: Diagnosis not present

## 2021-12-15 ENCOUNTER — Other Ambulatory Visit (HOSPITAL_COMMUNITY): Payer: Self-pay | Admitting: Surgery

## 2021-12-15 ENCOUNTER — Other Ambulatory Visit: Payer: Self-pay | Admitting: Surgery

## 2021-12-15 DIAGNOSIS — E213 Hyperparathyroidism, unspecified: Secondary | ICD-10-CM

## 2021-12-16 ENCOUNTER — Ambulatory Visit: Payer: Self-pay | Admitting: Surgery

## 2021-12-24 ENCOUNTER — Ambulatory Visit (HOSPITAL_COMMUNITY)
Admission: RE | Admit: 2021-12-24 | Discharge: 2021-12-24 | Disposition: A | Discharge status: Home / Self Care | Payer: BC Managed Care – PPO | Source: Ambulatory Visit | Attending: Surgery | Admitting: Surgery

## 2021-12-24 ENCOUNTER — Encounter (HOSPITAL_COMMUNITY)
Admission: RE | Admit: 2021-12-24 | Discharge: 2021-12-24 | Disposition: A | Discharge status: Home / Self Care | Payer: BC Managed Care – PPO | Source: Ambulatory Visit | Attending: Surgery | Admitting: Surgery

## 2021-12-24 DIAGNOSIS — E213 Hyperparathyroidism, unspecified: Secondary | ICD-10-CM | POA: Diagnosis not present

## 2021-12-24 DIAGNOSIS — D351 Benign neoplasm of parathyroid gland: Secondary | ICD-10-CM | POA: Diagnosis not present

## 2021-12-24 MED ORDER — TECHNETIUM TC 99M SESTAMIBI GENERIC - CARDIOLITE
25.3000 | Freq: Once | INTRAVENOUS | Status: AC | PRN
  Administered 2021-12-24: 25.3 via INTRAVENOUS

## 2021-12-29 ENCOUNTER — Ambulatory Visit: Payer: Self-pay | Admitting: Surgery

## 2021-12-29 NOTE — Progress Notes (Signed)
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  There is no significant evidence for a second gland adenoma.  I recommend proceeding with a left inferior minimally invasive parathyroidectomy as an outpatient surgical procedure as we discussed during her office visit.  Claiborne Billings - I will enter orders.  Please send to schedulers and ask them to contact the patient.  tmg  Armandina Gemma, [facility] [place] [facility] Office: [phone]

## 2022-01-14 ENCOUNTER — Encounter (HOSPITAL_COMMUNITY): Payer: Self-pay | Admitting: Surgery

## 2022-01-14 NOTE — H&P (Signed)
REFERRING PHYSICIAN: Shamleffer, Ibtehal  PROVIDER: Hrishikesh Hoeg Charlotta [place], MD   Chief Complaint: New Consultation (Primary hyperparathyroidism)  History of Present Illness:  Patient is referred by Dr. Vivia Ewing for surgical evaluation and management of primary hyperparathyroidism. Patient's primary care physician is Dr. Nicholos Johns. Patient was was initially diagnosed with hypercalcemia in 2021. She was lost to follow-up and then return to see her endocrinologist recently. Recent laboratory studies show a persistently elevated serum calcium level of 12.7. Previous readings have been greater than 13. Intact PTH level is elevated at 214. 24-hour urine collection for calcium was elevated at 374. 25 hydroxy vitamin D level was normal at 55. Patient underwent an ultrasound exam on December 07, 2021. This demonstrated an essentially normal thyroid gland with small cystic nodules less than 4 mm in size. However there was a hypoechoic mass measuring 3.5 x 1.4 x 1.0 cm located posterior to the lower left thyroid pole. This was felt to be consistent with a parathyroid adenoma. Patient has not had any other imaging studies. She has had no prior head or neck surgery. She does have a distant history of hyperthyroidism but is not currently on any thyroid medications. There is no family history of parathyroid disease or other endocrine neoplasms. Patient presents today accompanied by her husband.  Review of Systems: A complete review of systems was obtained from the patient. I have reviewed this information and discussed as appropriate with the patient. See HPI as well for other ROS.  Review of Systems  Constitutional: Positive for malaise/fatigue.  HENT: Negative.  Eyes: Negative.  Respiratory: Negative.  Cardiovascular: Negative.  Gastrointestinal: Negative.  Genitourinary: Negative.  Musculoskeletal:  Recent wrist fracture osteoporosis  Skin: Negative.  Neurological: Negative.   Endo/Heme/Allergies: Negative.  Psychiatric/Behavioral: Negative.   Medical History: Past Medical History:  Diagnosis Date  DVT (deep venous thrombosis) (CMS-HCC)  Thyroid disease   Patient Active Problem List  Diagnosis  Primary hyperparathyroidism (CMS-HCC)   Past Surgical History:  Procedure Laterality Date  ovarian cyst rupture 10/1989  bunionectomy 1997  fractured right forearm Right 04/2021    No Known Allergies  Current Outpatient Medications on File Prior to Visit  Medication Sig Dispense Refill  metoprolol succinate (TOPROL-XL) 25 MG XL tablet metoprolol succinate ER 25 mg tablet,extended release 24 hr Take 1 tablet (25 mg total) by mouth daily.   No current facility-administered medications on file prior to visit.   Family History  Problem Relation Age of Onset  Stroke Mother  Skin cancer Mother  High blood pressure (Hypertension) Mother  Coronary Artery Disease (Blocked arteries around heart) Mother  Deep vein thrombosis (DVT or abnormal blood clot formation) Mother  Hyperlipidemia (Elevated cholesterol) Father  High blood pressure (Hypertension) Father  Coronary Artery Disease (Blocked arteries around heart) Father  High blood pressure (Hypertension) Sister  Hyperlipidemia (Elevated cholesterol) Sister  Diabetes Sister    Social History   Tobacco Use  Smoking Status Never  Smokeless Tobacco Never    Social History   Socioeconomic History  Marital status: Married  Tobacco Use  Smoking status: Never  Smokeless tobacco: Never  Vaping Use  Vaping Use: Never used  Substance and Sexual Activity  Alcohol use: Yes  Drug use: Never     Physical Exam   GENERAL APPEARANCE Comfortable, no acute issues Development: normal Gross deformities: none  SKIN Rash, lesions, ulcers: none Induration, erythema: none Nodules: none palpable  EYES Conjunctiva and lids: normal Pupils: equal and reactive  EARS, NOSE, MOUTH,  THROAT External ears: no  lesion or deformity External nose: no lesion or deformity Hearing: grossly normal  NECK Symmetric: yes Trachea: midline Thyroid: no palpable nodules in the thyroid bed  CHEST Respiratory effort: normal Retraction or accessory muscle use: no Breath sounds: normal bilaterally Rales, rhonchi, wheeze: none  CARDIOVASCULAR Auscultation: regular rhythm, normal rate Murmurs: none Pulses: radial pulse 2+ palpable Lower extremity edema: none  ABDOMEN Not assessed  GENITOURINARY Not assessed  MUSCULOSKELETAL Station and gait: normal Digits and nails: no clubbing or cyanosis Muscle strength: grossly normal all extremities Range of motion: grossly normal all extremities Deformity: none  LYMPHATIC Cervical: none palpable Supraclavicular: none palpable  PSYCHIATRIC Oriented to person, place, and time: yes Mood and affect: normal for situation Judgment and insight: appropriate for situation    Assessment and Plan:   Primary hyperparathyroidism (CMS-HCC)  Patient is referred by her endocrinologist and primary care physician for surgical evaluation and management of primary hyperparathyroidism.  Patient provided with a copy of "Parathyroid Surgery: Treatment for Your Parathyroid Gland Problem", published by [employer], 12 pages. Book reviewed and explained to patient during visit today.  Patient has biochemical evidence of primary hyperparathyroidism with a very high calcium level at 12.7. Ultrasound examination demonstrates what appears to be a left inferior parathyroid adenoma measuring 3.5 cm in greatest dimension. Patient has not had a nuclear medicine parathyroid scan. I would like to obtain a sestamibi scan in order to confirm the location of the parathyroid adenoma and to rule out the possibility of a second gland adenoma. We discussed the study today. We will make arrangements for this study in the near future.  We also went ahead and discussed minimally invasive  parathyroidectomy. We discussed 4 gland exploration as well. I have recommended minimally invasive parathyroidectomy if the sestamibi scan confirms a single gland location in the left inferior position. This would be an outpatient surgical procedure. We discussed the size and location of the surgical incision. We discussed the postoperative recovery. We discussed the risk and benefits of the procedure including the risk of recurrent laryngeal nerve injury. The patient and her husband understand and wish to proceed.  Patient will be scheduled for the nuclear medicine parathyroid scan. I will contact her with those results when they are available. We will make plans for parathyroid surgery based on those results.   Armandina Gemma, MD [facility] [place] [facility] Office: [phone]

## 2022-01-17 DIAGNOSIS — R059 Cough, unspecified: Secondary | ICD-10-CM | POA: Diagnosis not present

## 2022-01-17 DIAGNOSIS — J040 Acute laryngitis: Secondary | ICD-10-CM | POA: Diagnosis not present

## 2022-01-18 NOTE — Progress Notes (Signed)
COVID Vaccine Completed:  Date of COVID positive in last 90 days:  PCP - Nicholos Johns, MD Cardiologist - Traci Turner,MD  Chest x-ray -  EKG - 06/08/21 [facility] Stress Test -  ECHO - 06/17/21 CE Cardiac Cath -  Pacemaker/ICD device last checked: Spinal Cord Stimulator:  Bowel Prep -   Sleep Study -  CPAP -   Fasting Blood Sugar -  Checks Blood Sugar _____ times a day  Blood Thinner Instructions: Aspirin Instructions: ASA 81 Last Dose:  Activity level:  Can go up a flight of stairs and perform activities of daily living without stopping and without symptoms of chest pain or shortness of breath.  Able to exercise without symptoms  Unable to go up a flight of stairs without symptoms of     Anesthesia review: DVT, MVP, nonischemic cardiomyopathy, paroxysmal atrial tachycardia, PVCs  Patient denies shortness of breath, fever, cough and chest pain at PAT appointment  Patient verbalized understanding of instructions that were given to them at the PAT appointment. Patient was also instructed that they will need to review over the PAT instructions again at home before surgery.

## 2022-01-18 NOTE — Patient Instructions (Signed)
SURGICAL WAITING ROOM VISITATION Patients having surgery or a procedure may have no more than 2 support people in the waiting area - these visitors may rotate.   Children under the age of 52 must have an adult with them who is not the patient. If the patient needs to stay at the hospital during part of their recovery, the visitor guidelines for inpatient rooms apply. Pre-op nurse will coordinate an appropriate time for 1 support person to accompany patient in pre-op.  This support person may not rotate.    Please refer to the [facility] website for the visitor guidelines for Inpatients (after your surgery is over and you are in a regular room).    Your procedure is scheduled on: 01/22/22   Report to [facility] Main Entrance    Report to admitting at 7:15 AM   Call this number if you have problems the morning of surgery [phone]   Do not eat food :After Midnight.   After Midnight you may have the following liquids until 6:30 AM DAY OF SURGERY  Water Non-Citrus Juices (without pulp, NO RED) Carbonated Beverages Black Coffee (NO MILK/CREAM OR CREAMERS, sugar ok)  Clear Tea (NO MILK/CREAM OR CREAMERS, sugar ok) regular and decaf                             Plain Jell-O (NO RED)                                           Fruit ices (not with fruit pulp, NO RED)                                     Popsicles (NO RED)                                                               Sports drinks like Gatorade (NO RED)              FOLLOW BOWEL PREP AND ANY ADDITIONAL PRE OP INSTRUCTIONS YOU RECEIVED FROM YOUR SURGEON'S OFFICE!!!     Oral Hygiene is also important to reduce your risk of infection.                                    Remember - BRUSH YOUR TEETH THE MORNING OF SURGERY WITH YOUR REGULAR TOOTHPASTE   Take these medicines the morning of surgery with A SIP OF WATER: Metoprolol                               You may not have any metal on your body including hair pins,  jewelry, and body piercing             Do not wear make-up, lotions, powders, perfumes, or deodorant  Do not wear nail polish including gel and S&S, artificial/acrylic nails, or any other type of covering on natural nails including finger and toenails.  If you have artificial nails, gel coating, etc. that needs to be removed by a nail salon please have this removed prior to surgery or surgery may need to be canceled/ delayed if the surgeon/ anesthesia feels like they are unable to be safely monitored.   Do not shave  48 hours prior to surgery.    Do not bring valuables to the hospital. [place].   Contacts, dentures or bridgework may not be worn into surgery.  DO NOT [place]. PHARMACY WILL DISPENSE MEDICATIONS LISTED ON YOUR MEDICATION LIST TO YOU DURING YOUR ADMISSION [place]!    Patients discharged on the day of surgery will not be allowed to drive home.  Someone NEEDS to stay with you for the first 24 hours after anesthesia.   Special Instructions: Bring a copy of your healthcare power of attorney and living will documents         the day of surgery if you haven't scanned them before.              Please read over the following fact sheets you were given: IF YOU HAVE QUESTIONS ABOUT YOUR PRE-OP INSTRUCTIONS PLEASE CALL [facility] - Preparing for Surgery Before surgery, you can play an important role.  Because skin is not sterile, your skin needs to be as free of germs as possible.  You can reduce the number of germs on your skin by washing with CHG (chlorahexidine gluconate) soap before surgery.  CHG is an antiseptic cleaner which kills germs and bonds with the skin to continue killing germs even after washing. Please DO NOT use if you have an allergy to CHG or antibacterial soaps.  If your skin becomes reddened/irritated stop using the CHG and inform your nurse when you  arrive at Short Stay. Do not shave (including legs and underarms) for at least 48 hours prior to the first CHG shower.  You may shave your face/neck.  Please follow these instructions carefully:  1.  Shower with CHG Soap the night before surgery and the  morning of surgery.  2.  If you choose to wash your hair, wash your hair first as usual with your normal  shampoo.  3.  After you shampoo, rinse your hair and body thoroughly to remove the shampoo.                             4.  Use CHG as you would any other liquid soap.  You can apply chg directly to the skin and wash.  Gently with a scrungie or clean washcloth.  5.  Apply the CHG Soap to your body ONLY FROM THE NECK DOWN.   Do   not use on face/ open                           Wound or open sores. Avoid contact with eyes, ears mouth and   genitals (private parts).                       Wash face,  Genitals (private parts) with your normal soap.             6.  Wash thoroughly, paying special attention to the  area where your    surgery  will be performed.  7.  Thoroughly rinse your body with warm water from the neck down.  8.  DO NOT shower/wash with your normal soap after using and rinsing off the CHG Soap.                9.  Pat yourself dry with a clean towel.            10.  Wear clean pajamas.            11.  Place clean sheets on your bed the night of your first shower and do not  sleep with pets. Day of Surgery : Do not apply any lotions/deodorants the morning of surgery.  Please wear clean clothes to the hospital/surgery center.  FAILURE TO FOLLOW THESE INSTRUCTIONS MAY RESULT IN THE CANCELLATION OF YOUR SURGERY  PATIENT SIGNATURE_________________________________  NURSE SIGNATURE__________________________________  ________________________________________________________________________

## 2022-01-19 ENCOUNTER — Encounter (HOSPITAL_COMMUNITY)
Admission: RE | Admit: 2022-01-19 | Discharge: 2022-01-19 | Disposition: A | Discharge status: Home / Self Care | Payer: BC Managed Care – PPO | Source: Ambulatory Visit | Attending: Surgery | Admitting: Surgery

## 2022-01-19 ENCOUNTER — Encounter (HOSPITAL_COMMUNITY): Payer: Self-pay

## 2022-01-19 VITALS — BP 154/75 | HR 64 | Temp 99.1°F | Resp 12 | Ht 65.0 in | Wt 145.6 lb

## 2022-01-19 DIAGNOSIS — I341 Nonrheumatic mitral (valve) prolapse: Secondary | ICD-10-CM | POA: Insufficient documentation

## 2022-01-19 DIAGNOSIS — Z01812 Encounter for preprocedural laboratory examination: Secondary | ICD-10-CM | POA: Insufficient documentation

## 2022-01-19 DIAGNOSIS — R051 Acute cough: Secondary | ICD-10-CM | POA: Diagnosis not present

## 2022-01-19 DIAGNOSIS — E210 Primary hyperparathyroidism: Secondary | ICD-10-CM | POA: Insufficient documentation

## 2022-01-19 DIAGNOSIS — I428 Other cardiomyopathies: Secondary | ICD-10-CM | POA: Insufficient documentation

## 2022-01-19 DIAGNOSIS — I480 Paroxysmal atrial fibrillation: Secondary | ICD-10-CM | POA: Insufficient documentation

## 2022-01-19 DIAGNOSIS — D351 Benign neoplasm of parathyroid gland: Secondary | ICD-10-CM | POA: Diagnosis not present

## 2022-01-19 DIAGNOSIS — Z86718 Personal history of other venous thrombosis and embolism: Secondary | ICD-10-CM | POA: Insufficient documentation

## 2022-01-19 HISTORY — DX: Age-related osteoporosis without current pathological fracture: M81.0

## 2022-01-19 HISTORY — DX: Personal history of urinary calculi: Z87.442

## 2022-01-19 LAB — BASIC METABOLIC PANEL
Anion gap: 6 (ref 5–15)
BUN: 15 mg/dL (ref 6–20)
CO2: 27 mmol/L (ref 22–32)
Calcium: 12.1 mg/dL — ABNORMAL HIGH (ref 8.9–10.3)
Chloride: 103 mmol/L (ref 98–111)
Creatinine, Ser: 0.59 mg/dL (ref 0.44–1.00)
GFR, Estimated: >60 mL/min (ref >60)
Glucose, Bld: 119 mg/dL — ABNORMAL HIGH (ref 70–99)
Potassium: 4.3 mmol/L (ref 3.5–5.1)
Sodium: 136 mmol/L (ref 135–145)

## 2022-01-19 LAB — CBC
HCT: 42 % (ref 36.0–46.0)
Hemoglobin: 13.4 g/dL (ref 12.0–15.0)
MCH: 30.7 pg (ref 26.0–34.0)
MCHC: 31.9 g/dL (ref 30.0–36.0)
MCV: 96.1 fL (ref 80.0–100.0)
Platelets: 203 10*3/uL (ref 150–400)
RBC: 4.37 MIL/uL (ref 3.87–5.11)
RDW: 13.2 % (ref 11.5–15.5)
WBC: 6.4 10*3/uL (ref 4.0–10.5)
nRBC: 0 % (ref 0.0–0.2)

## 2022-01-20 NOTE — Progress Notes (Signed)
Anesthesia Chart Review   Case: [id number] Date/Time: 01/22/22 0915   Procedure: LEFT INFERIOR PARATHYROIDECTOMY (Left)   Anesthesia type: General   Pre-op diagnosis: PRIMARY HYPERPARATHYROIDISM   Location: [facility] ROOM 01 / [facility]   Surgeons: Armandina Gemma, MD       DISCUSSION:56 y.o. never smoker with h/o DVT, NICM, MVP, paroxysmal atrial tachycardia, Small perimembranous VSD, primary hyperparathyroidism scheduled for above procedure 01/22/2022 with Dr. Armandina Gemma.   Pt last seen by cardiology 06/08/2021, stable at this visit with 1 year follow up. Per OV note exertional fatigue is noncardiac, coronary CTA with CA score of 0 and no CAD. 2 week Zio patch showed nonsustained atrial tachycardia with NSVT, improved with BB.   Anticipate pt can proceed with planned procedure barring acute status change.   VS: BP (!) 154/75   Pulse 64   Temp 37.3 C (Oral)   Resp 12   Ht '5\' 5"'$  (1.651 m)   Wt 66 kg   SpO2 100%   BMI 24.23 kg/m   PROVIDERS: Nicholos Johns, MD is PCP   Forde Dandy, MD is Cardiologist  LABS: Labs reviewed: Acceptable for surgery. (all labs ordered are listed, but only abnormal results are displayed)  Labs Reviewed  BASIC METABOLIC PANEL - Abnormal; Notable for the following components:      Result Value   Glucose, Bld 119 (*)    Calcium 12.1 (*)    All other components within normal limits  CBC     IMAGES:   EKG: EKG on chart  CV: Echo 06/17/2021 SUMMARY  No previous study for comparison.  Negative bubble study for Ventricular Septal Defect  The left ventricular size is normal.  There is normal left ventricular wall thickness.  Left ventricular systolic function is normal.  LV ejection fraction = 55-60%.  Mild apical and anterior hypokinesis noted in some views. Overall  normal systolic function  Thickened mitral valve leaflets with prolapse of the posterior  leaflet. There is mild mitral regurgitation.   Coronary CTA 06/2019 IMPRESSION: 1.  Coronary calcium score of 0. This was 0 percentile for age and sex matched control. 2. Normal coronary origin with right dominance. 3. No evidence of CAD. 4. Consider non-atherosclerotic causes of chest pain. Past Medical History:  Diagnosis Date   DVT (deep venous thrombosis) (HCC)    History of kidney stones    Hypoglycemia    MVP (mitral valve prolapse)    bileaflet MVP and mild to moderate MR on cardiac MRI   NICM (nonischemic cardiomyopathy) (HCC)    EF reduced on echo but normal on cardiac MRI with no LGE - EF 55%   Osteoporosis    Ovarian cyst    PAT (paroxysmal atrial tachycardia) (HCC)    PVC's (premature ventricular contractions)    SVT (supraventricular tachycardia) (HCC)    Venous hypertension    VSD (ventricular septal defect), perimembranous    very small on cardiac MRI 12/2019   Weakness of lower extremity    Wide-complex tachycardia    a. event monitor 06/2019 - possible NSVT vs AT with aberration    Past Surgical History:  Procedure Laterality Date   BUNIONECTOMY Right    COLONOSCOPY     x4   ENDOVENOUS ABLATION SAPHENOUS VEIN W/ LASER     HAMMER TOE SURGERY Right    OVARIAN CYST REMOVAL     WRIST FRACTURE SURGERY Right     MEDICATIONS:  amoxicillin (AMOXIL) 125 MG chewable tablet   predniSONE (DELTASONE)  1 MG tablet   Ascorbic Acid (VITAMIN C) 500 MG CHEW   aspirin EC 81 MG tablet   diphenhydrAMINE (BENADRYL) 25 MG tablet   metoprolol succinate (TOPROL-XL) 25 MG 24 hr tablet   Omega-3 Fatty Acids (FISH OIL TRIPLE STRENGTH) 1400 MG CAPS   pyridOXINE (VITAMIN B-6) 100 MG tablet   sodium chloride (OCEAN) 0.65 % SOLN nasal spray   vitamin B-12 (CYANOCOBALAMIN) 1000 MCG tablet   zinc gluconate 50 MG tablet   No current facility-administered medications for this encounter.     Konrad Felix Ward, PA-C [facility] [phone]

## 2022-01-20 NOTE — Anesthesia Preprocedure Evaluation (Addendum)
Anesthesia Evaluation  Patient identified by MRN, date of birth, ID band Patient awake    Reviewed: Allergy & Precautions, NPO status , Patient's Chart, lab work & pertinent test results  Airway Mallampati: II  TM Distance: >3 FB     Dental   Pulmonary neg pulmonary ROS,    breath sounds clear to auscultation       Cardiovascular + dysrhythmias + Valvular Problems/Murmurs MR  Rhythm:Regular Rate:Normal  Hx noted Dr. Nyoka Cowden   Neuro/Psych negative neurological ROS     GI/Hepatic negative GI ROS, Neg liver ROS,   Endo/Other  negative endocrine ROS  Renal/GU negative Renal ROS     Musculoskeletal   Abdominal   Peds  Hematology   Anesthesia Other Findings   Reproductive/Obstetrics                            Anesthesia Physical Anesthesia Plan  ASA: 3  Anesthesia Plan: General   Post-op Pain Management:    Induction: Intravenous  PONV Risk Score and Plan: 3 and Ondansetron, Dexamethasone and Midazolam  Airway Management Planned: Oral ETT  Additional Equipment:   Intra-op Plan:   Post-operative Plan: Extubation in OR  Informed Consent: I have reviewed the patients History and Physical, chart, labs and discussed the procedure including the risks, benefits and alternatives for the proposed anesthesia with the patient or authorized representative who has indicated his/her understanding and acceptance.     Dental advisory given  Plan Discussed with: CRNA and Anesthesiologist  Anesthesia Plan Comments: (See PAT note 01/19/2022)      Anesthesia Quick Evaluation

## 2022-01-22 ENCOUNTER — Encounter (HOSPITAL_COMMUNITY)
Admission: RE | Disposition: A | Discharge status: Home / Self Care | Payer: Self-pay | Source: Home / Self Care | Attending: Surgery

## 2022-01-22 ENCOUNTER — Other Ambulatory Visit: Payer: Self-pay

## 2022-01-22 ENCOUNTER — Ambulatory Visit (HOSPITAL_COMMUNITY): Payer: BC Managed Care – PPO | Admitting: Physician Assistant

## 2022-01-22 ENCOUNTER — Ambulatory Visit (HOSPITAL_COMMUNITY): Payer: BC Managed Care – PPO | Admitting: Anesthesiology

## 2022-01-22 ENCOUNTER — Ambulatory Visit (HOSPITAL_COMMUNITY)
Admission: RE | Admit: 2022-01-22 | Discharge: 2022-01-22 | Disposition: A | Discharge status: Home / Self Care | Payer: BC Managed Care – PPO | Attending: Surgery | Admitting: Surgery

## 2022-01-22 ENCOUNTER — Encounter (HOSPITAL_COMMUNITY): Payer: Self-pay | Admitting: Surgery

## 2022-01-22 DIAGNOSIS — E210 Primary hyperparathyroidism: Secondary | ICD-10-CM | POA: Diagnosis not present

## 2022-01-22 DIAGNOSIS — D351 Benign neoplasm of parathyroid gland: Secondary | ICD-10-CM | POA: Insufficient documentation

## 2022-01-22 DIAGNOSIS — I340 Nonrheumatic mitral (valve) insufficiency: Secondary | ICD-10-CM | POA: Diagnosis not present

## 2022-01-22 HISTORY — PX: PARATHYROIDECTOMY: SHX19

## 2022-01-22 SURGERY — PARATHYROIDECTOMY
Anesthesia: General | Laterality: Left

## 2022-01-22 MED ORDER — MIDAZOLAM HCL 2 MG/2ML IJ SOLN
INTRAMUSCULAR | Status: AC
  Filled 2022-01-22: qty 2

## 2022-01-22 MED ORDER — PROPOFOL 10 MG/ML IV BOLUS
INTRAVENOUS | Status: DC | PRN
  Administered 2022-01-22: 150 mg via INTRAVENOUS

## 2022-01-22 MED ORDER — DEXAMETHASONE SODIUM PHOSPHATE 10 MG/ML IJ SOLN
INTRAMUSCULAR | Status: AC
  Filled 2022-01-22: qty 1

## 2022-01-22 MED ORDER — CHLORHEXIDINE GLUCONATE CLOTH 2 % EX PADS: 6.0000 | MEDICATED_PAD | Freq: Once | CUTANEOUS | Status: DC

## 2022-01-22 MED ORDER — HYDROMORPHONE HCL 1 MG/ML IJ SOLN: 0.2500 mg | INTRAMUSCULAR | Status: DC | PRN

## 2022-01-22 MED ORDER — CEFAZOLIN SODIUM-DEXTROSE 2-4 GM/100ML-% IV SOLN
2.0000 g | INTRAVENOUS | Status: AC
  Administered 2022-01-22: 2 g via INTRAVENOUS

## 2022-01-22 MED ORDER — 0.9 % SODIUM CHLORIDE (POUR BTL) OPTIME
TOPICAL | Status: DC | PRN
  Administered 2022-01-22: 1000 mL

## 2022-01-22 MED ORDER — LIDOCAINE HCL (PF) 2 % IJ SOLN
INTRAMUSCULAR | Status: AC
  Filled 2022-01-22: qty 5

## 2022-01-22 MED ORDER — ROCURONIUM BROMIDE 100 MG/10ML IV SOLN
INTRAVENOUS | Status: DC | PRN
  Administered 2022-01-22: 50 mg via INTRAVENOUS

## 2022-01-22 MED ORDER — ROCURONIUM BROMIDE 10 MG/ML (PF) SYRINGE
PREFILLED_SYRINGE | INTRAVENOUS | Status: AC
  Filled 2022-01-22: qty 10

## 2022-01-22 MED ORDER — FENTANYL CITRATE (PF) 250 MCG/5ML IJ SOLN
INTRAMUSCULAR | Status: AC
  Filled 2022-01-22: qty 5

## 2022-01-22 MED ORDER — SUGAMMADEX SODIUM 500 MG/5ML IV SOLN
INTRAVENOUS | Status: DC | PRN
  Administered 2022-01-22: 200 mg via INTRAVENOUS

## 2022-01-22 MED ORDER — TRAMADOL HCL 50 MG PO TABS: 50.0000 mg | ORAL_TABLET | Freq: Four times a day (QID) | ORAL | 0 refills | Status: AC | PRN

## 2022-01-22 MED ORDER — BUPIVACAINE HCL 0.25 % IJ SOLN
INTRAMUSCULAR | Status: DC | PRN
  Administered 2022-01-22: 10 mL

## 2022-01-22 MED ORDER — FENTANYL CITRATE (PF) 100 MCG/2ML IJ SOLN
INTRAMUSCULAR | Status: DC | PRN
  Administered 2022-01-22: 100 ug via INTRAVENOUS
  Administered 2022-01-22: 50 ug via INTRAVENOUS
  Administered 2022-01-22: 100 ug via INTRAVENOUS

## 2022-01-22 MED ORDER — ONDANSETRON HCL 4 MG/2ML IJ SOLN
INTRAMUSCULAR | Status: DC | PRN
  Administered 2022-01-22: 4 mg via INTRAVENOUS

## 2022-01-22 MED ORDER — LIDOCAINE HCL (CARDIAC) PF 100 MG/5ML IV SOSY
PREFILLED_SYRINGE | INTRAVENOUS | Status: DC | PRN
  Administered 2022-01-22: 50 mg via INTRAVENOUS

## 2022-01-22 MED ORDER — MIDAZOLAM HCL 5 MG/5ML IJ SOLN
INTRAMUSCULAR | Status: DC | PRN
  Administered 2022-01-22: 2 mg via INTRAVENOUS

## 2022-01-22 MED ORDER — LACTATED RINGERS IV SOLN: INTRAVENOUS | Status: DC

## 2022-01-22 MED ORDER — ORAL CARE MOUTH RINSE: 15.0000 mL | Freq: Once | OROMUCOSAL | Status: AC

## 2022-01-22 MED ORDER — PROPOFOL 10 MG/ML IV BOLUS
INTRAVENOUS | Status: AC
  Filled 2022-01-22: qty 20

## 2022-01-22 MED ORDER — HEMOSTATIC AGENTS (NO CHARGE) OPTIME
TOPICAL | Status: DC | PRN
  Administered 2022-01-22: 1

## 2022-01-22 MED ORDER — BUPIVACAINE HCL 0.25 % IJ SOLN
INTRAMUSCULAR | Status: AC
  Filled 2022-01-22: qty 1

## 2022-01-22 MED ORDER — CEFAZOLIN SODIUM-DEXTROSE 2-4 GM/100ML-% IV SOLN
INTRAVENOUS | Status: AC
  Filled 2022-01-22: qty 100

## 2022-01-22 MED ORDER — ONDANSETRON HCL 4 MG/2ML IJ SOLN
INTRAMUSCULAR | Status: AC
  Filled 2022-01-22: qty 2

## 2022-01-22 MED ORDER — KETOROLAC TROMETHAMINE 30 MG/ML IJ SOLN
INTRAMUSCULAR | Status: DC | PRN
  Administered 2022-01-22: 30 mg via INTRAVENOUS

## 2022-01-22 MED ORDER — CHLORHEXIDINE GLUCONATE 0.12 % MT SOLN
15.0000 mL | Freq: Once | OROMUCOSAL | Status: AC
  Administered 2022-01-22: 15 mL via OROMUCOSAL

## 2022-01-22 MED ORDER — DEXAMETHASONE SODIUM PHOSPHATE 10 MG/ML IJ SOLN
INTRAMUSCULAR | Status: DC | PRN
  Administered 2022-01-22: 6 mg via INTRAVENOUS

## 2022-01-22 SURGICAL SUPPLY — 38 items
ADH SKN CLS APL DERMABOND .7 (GAUZE/BANDAGES/DRESSINGS) ×1
APL PRP STRL LF DISP 70% ISPRP (MISCELLANEOUS) ×1
ATTRACTOMAT 16X20 MAGNETIC DRP (DRAPES) ×2 IMPLANT
BAG COUNTER SPONGE SURGICOUNT (BAG) ×2 IMPLANT
BAG SPNG CNTER NS LX DISP (BAG) ×1
BLADE SURG 15 STRL LF DISP TIS (BLADE) ×1 IMPLANT
BLADE SURG 15 STRL SS (BLADE) ×2
CHLORAPREP W/TINT 26 (MISCELLANEOUS) ×2 IMPLANT
CLIP TI MEDIUM 6 (CLIP) ×4 IMPLANT
CLIP TI WIDE RED SMALL 6 (CLIP) ×4 IMPLANT
COVER SURGICAL LIGHT HANDLE (MISCELLANEOUS) ×2 IMPLANT
DERMABOND ADVANCED (GAUZE/BANDAGES/DRESSINGS) ×1
DERMABOND ADVANCED .7 DNX12 (GAUZE/BANDAGES/DRESSINGS) ×1 IMPLANT
DRAPE LAPAROTOMY T 98X78 PEDS (DRAPES) ×2 IMPLANT
DRAPE UTILITY XL STRL (DRAPES) ×2 IMPLANT
ELECT REM PT RETURN 15FT ADLT (MISCELLANEOUS) ×2 IMPLANT
GAUZE 4X4 16PLY ~~LOC~~+RFID DBL (SPONGE) ×2 IMPLANT
GLOVE SURG ORTHO 8.0 STRL STRW (GLOVE) ×2 IMPLANT
GLOVE SURG SYN 7.5  E (GLOVE) ×6
GLOVE SURG SYN 7.5 E (GLOVE) ×3 IMPLANT
GLOVE SURG SYN 7.5 PF PI (GLOVE) ×3 IMPLANT
GOWN STRL REUS W/ TWL XL LVL3 (GOWN DISPOSABLE) ×3 IMPLANT
GOWN STRL REUS W/TWL XL LVL3 (GOWN DISPOSABLE) ×6
HEMOSTAT SURGICEL 2X4 FIBR (HEMOSTASIS) ×2 IMPLANT
ILLUMINATOR WAVEGUIDE N/F (MISCELLANEOUS) IMPLANT
KIT BASIN OR (CUSTOM PROCEDURE TRAY) ×2 IMPLANT
KIT TURNOVER KIT A (KITS) IMPLANT
NDL HYPO 25X1 1.5 SAFETY (NEEDLE) ×1 IMPLANT
NEEDLE HYPO 25X1 1.5 SAFETY (NEEDLE) ×2 IMPLANT
PACK BASIC VI WITH GOWN DISP (CUSTOM PROCEDURE TRAY) ×2 IMPLANT
PENCIL SMOKE EVACUATOR (MISCELLANEOUS) ×2 IMPLANT
SUT MNCRL AB 4-0 PS2 18 (SUTURE) ×2 IMPLANT
SUT VIC AB 3-0 SH 18 (SUTURE) ×2 IMPLANT
SYR BULB IRRIG 60ML STRL (SYRINGE) ×2 IMPLANT
SYR CONTROL 10ML LL (SYRINGE) ×2 IMPLANT
TOWEL OR 17X26 10 PK STRL BLUE (TOWEL DISPOSABLE) ×2 IMPLANT
TOWEL OR NON WOVEN STRL DISP B (DISPOSABLE) ×2 IMPLANT
TUBING CONNECTING 10 (TUBING) ×2 IMPLANT

## 2022-01-22 NOTE — Op Note (Addendum)
Operative Note  Pre-operative Diagnosis:  primary hyperparathyroidism   Post-operative Diagnosis:  same  Surgeon:  Armandina Gemma, MD  Assistant:  Mohammed Kindle, RN   Procedure:  left parathyroidectomy  Anesthesia:  general  Estimated Blood Loss:  5 cc  Drains: none         Specimen: to pathology - frozen consistent with parathyroid adenoma  Indications:  Patient is referred by Dr. Vivia Ewing for surgical evaluation and management of primary hyperparathyroidism. Patient's primary care physician is Dr. Nicholos Johns. Patient was was initially diagnosed with hypercalcemia in 2021. She was lost to follow-up and then return to see her endocrinologist recently. Recent laboratory studies show a persistently elevated serum calcium level of 12.7. Previous readings have been greater than 13. Intact PTH level is elevated at 214. 24-hour urine collection for calcium was elevated at 374. 25 hydroxy vitamin D level was normal at 55. Patient underwent an ultrasound exam on December 07, 2021. This demonstrated an essentially normal thyroid gland with small cystic nodules less than 4 mm in size. However there was a hypoechoic mass measuring 3.5 x 1.4 x 1.0 cm located posterior to the lower left thyroid pole. This was felt to be consistent with a parathyroid adenoma.  Nuclear medicine parathyroid scan localized a left inferior parathyroid adenoma.  Patient now comes to surgery for parathyroidectomy  Procedure:  The patient was seen in the pre-op holding area. The risks, benefits, complications, treatment options, and expected outcomes were previously discussed with the patient. The patient agreed with the proposed Hanson and has signed the informed consent form.  The patient was brought to the operating room by the surgical team, identified as Jessica Hanson and the procedure verified. A "time out" was completed and the above information confirmed.  Following administration of general anesthesia, the  patient is positioned and then prepped and draped in usual aseptic fashion.  After ascertaining that an adequate level of anesthesia been achieved, a left anterior cervical incision is made with a #15 blade.  Dissection is carried through subcutaneous tissues and platysma.  Hemostasis is achieved with the electrocautery.  Subplatysmal flaps are developed circumferentially and a self-retaining retractors placed.  Strap muscles are incised in the midline.  Strap muscles are reflected towards the left exposing what appears to be a relatively normal left thyroid lobe.  Palpation reveals a mass located posterior to the mid and inferior portion of the left thyroid lobe.  Dissection shows an enlarged parathyroid gland laying against the precervical fascia posterior to the inferior thyroid artery.  This is gently dissected out and mobilized.  It is delivered from behind the inferior thyroid artery superiorly.  It appears that the blood supply comes and superiorly, therefore most likely representing a superior parathyroid adenoma.  Gland is gently mobilized taking care to avoid the underlying recurrent nerve.  Vascular structures are divided between small ligaclips.  The entire gland is excised.  It is submitted to pathology where frozen section confirms hypercellular parathyroid tissue consistent with parathyroid adenoma.  The gland weighed nearly 3 g and measured over 3 cm in length.  Neck is irrigated with warm saline.  Good hemostasis is noted throughout the operative field.  No other enlarged parathyroid tissue is visible on the left side of the neck.  There does appear to be a normal small suppressed parathyroid gland at the inferior pole the left thyroid lobe.  Fibrillar was placed throughout the operative field.  Strap muscles are reapproximated in the midline of  interrupted 3-0 Vicryl sutures.  Platysma is closed with interrupted 3-0 Vicryl sutures.  Skin is anesthetized with local anesthetic.  Skin incision is  closed with a running 4-0 Monocryl subcuticular suture.  Wound is washed and dried and Dermabond is applied as dressing.  Patient is awakened from anesthesia and transported to the recovery room.  The patient tolerated the procedure well.   Armandina Gemma, [facility] Office: [phone]

## 2022-01-22 NOTE — Transfer of Care (Signed)
Immediate Anesthesia Transfer of Care Note  Patient: Lamoyne Palencia  Procedure(s) Performed: LEFT PARATHYROIDECTOMY (Left)  Patient Location: PACU  Anesthesia Type:General  Level of Consciousness: awake, alert , oriented and patient cooperative  Airway & Oxygen Therapy: Patient Spontanous Breathing and Patient connected to face mask oxygen  Post-op Assessment: Report given to RN, Post -op Vital signs reviewed and stable and Patient moving all extremities X 4  Post vital signs: Reviewed and stable  Last Vitals:  Vitals Value Taken Time  BP 133/70 01/22/22 1035  Temp    Pulse 64 01/22/22 1039  Resp 9 01/22/22 1039  SpO2 96 % 01/22/22 1039  Vitals shown include unvalidated device data.  Last Pain:  Vitals:   01/22/22 0747  TempSrc:   PainSc: 0-No pain         Complications: No notable events documented.

## 2022-01-22 NOTE — Anesthesia Procedure Notes (Signed)
Procedure Name: Intubation Date/Time: 01/22/2022 9:34 AM  Performed by: Jonna Munro, CRNAPre-anesthesia Checklist: Patient identified, Emergency Drugs available, Suction available, Patient being monitored and Timeout performed Patient Re-evaluated:Patient Re-evaluated prior to induction Oxygen Delivery Method: Circle system utilized Preoxygenation: Pre-oxygenation with 100% oxygen Induction Type: IV induction Ventilation: Mask ventilation without difficulty Laryngoscope Size: Mac and 3 Grade View: Grade I Tube type: Oral Tube size: 7.0 mm Number of attempts: 1 Airway Equipment and Method: Stylet Placement Confirmation: ETT inserted through vocal cords under direct vision, positive ETCO2 and breath sounds checked- equal and bilateral Secured at: 22 cm Tube secured with: Tape Dental Injury: Teeth and Oropharynx as per pre-operative assessment

## 2022-01-22 NOTE — Anesthesia Postprocedure Evaluation (Signed)
Anesthesia Post Note  Patient: Jessica Hanson  Procedure(s) Performed: LEFT PARATHYROIDECTOMY (Left)     Patient location during evaluation: PACU Anesthesia Type: General Level of consciousness: awake Pain management: pain level controlled Vital Signs Assessment: post-procedure vital signs reviewed and stable Respiratory status: spontaneous breathing Cardiovascular status: stable Postop Assessment: no apparent nausea or vomiting Anesthetic complications: no   No notable events documented.  Last Vitals:  Vitals:   01/22/22 1122 01/22/22 1125  BP:    Pulse: (!) 50 66  Resp: 13   Temp:    SpO2: 93% 93%    Last Pain:  Vitals:   01/22/22 1115  TempSrc:   PainSc: 0-No pain                 Soniya Ashraf

## 2022-01-22 NOTE — Interval H&P Note (Signed)
History and Physical Interval Note:  01/22/2022 9:00 AM  Jessica [place]  has presented today for surgery, with the diagnosis of PRIMARY HYPERPARATHYROIDISM.  The various methods of treatment have been discussed with the patient and family. After consideration of risks, benefits and other options for treatment, the patient has consented to    Procedure(s): LEFT INFERIOR PARATHYROIDECTOMY (Left) as a surgical intervention.    The patient's history has been reviewed, patient examined, no change in status, stable for surgery.  I have reviewed the patient's chart and labs.  Questions were answered to the patient's satisfaction.    Armandina Gemma, [facility] Office: [place]

## 2022-01-22 NOTE — Discharge Instructions (Addendum)
[facility] - Dr. Todd Gerkin  THYROID & PARATHYROID SURGERY:  POST-OP INSTRUCTIONS  Always review the instruction sheet provided by the hospital nurse at discharge.  A prescription for pain medication may be sent to your pharmacy at the time of discharge.  Take your pain medication as prescribed.  If narcotic pain medicine is not needed, then you may take acetaminophen (Tylenol) or ibuprofen (Advil) as needed for pain or soreness.  Take your normal home medications as prescribed unless otherwise directed.  If you need a refill on your pain medication, please contact the office during regular business hours.  Prescriptions will not be processed by the office after 5:00PM or on weekends.  Start with a light diet upon arrival home, such as soup and crackers or toast.  Be sure to drink plenty of fluids.  Resume your normal diet the day after surgery.  Most patients will experience some swelling and bruising on the chest and neck area.  Ice packs will help for the first 48 hours after arriving home.  Swelling and bruising will take several days to resolve.   It is common to experience some constipation after surgery.  Increasing fluid intake and taking a stool softener (Colace) will usually help to prevent this problem.  A mild laxative (Milk of Magnesia or Miralax) should be taken according to package directions if there has been no bowel movement after 48 hours.  Dermabond glue covers your incision. This seals the wound and you may shower at any time. The Dermabond will remain in place for about a week.  You may gradually remove the glue when it loosens around the edges.  If you need to loosen the Dermabond for removal, apply a layer of Vaseline to the wound for 15 minutes and then remove with a Kleenex. Your sutures are under the skin and will not show - they will dissolve on their own.  You may resume light daily activities beginning the day after discharge (such as self-care,  walking, climbing stairs), gradually increasing activities as tolerated. You may have sexual intercourse when it is comfortable. Refrain from any heavy lifting or straining until approved by your doctor. You may drive when you no longer are taking prescription pain medication, you can comfortably wear a seatbelt, and you can safely maneuver your car and apply the brakes.  You will see your doctor in the office for a follow-up appointment approximately three weeks after your surgery.  Make sure that you call for this appointment within a day or two after you arrive home to insure a convenient appointment time. Please have any requested laboratory tests performed a few days prior to your office visit so that the results will be available at your follow up appointment.  WHEN TO CALL THE [facility] OFFICE: -- Fever greater than 101.5 -- Inability to urinate -- Nausea and/or vomiting - persistent -- Extreme swelling or bruising -- Continued bleeding from incision -- Increased pain, redness, or drainage from the incision -- Difficulty swallowing or breathing -- Muscle cramping or spasms -- Numbness or tingling in hands or around lips  The clinic staff is available to answer your questions during regular business hours.  Please don't hesitate to call and ask to speak to one of the nurses if you have concerns.  [facility] OFFICE: [phone] (24 hours)  Please sign up for MyChart accounts. This will allow you to communicate directly with my nurse or myself without having to call the office. It will also allow you   to view your test results. You will need to enroll in MyChart for my office ([facility]) and for the hospital ([facility]).  Todd Gerkin, MD [facility] 

## 2022-01-23 ENCOUNTER — Encounter (HOSPITAL_COMMUNITY): Payer: Self-pay | Admitting: Surgery

## 2022-01-25 LAB — SURGICAL PATHOLOGY

## 2022-01-26 NOTE — Progress Notes (Signed)
     __Agree with the path finding of parathyroid adenoma  Armandina Gemma, MD [facility] [place] [facility] Office: [phone]

## 2022-02-03 DIAGNOSIS — E785 Hyperlipidemia, unspecified: Secondary | ICD-10-CM | POA: Diagnosis not present

## 2022-02-03 DIAGNOSIS — Z8679 Personal history of other diseases of the circulatory system: Secondary | ICD-10-CM | POA: Diagnosis not present

## 2022-02-03 DIAGNOSIS — Z8639 Personal history of other endocrine, nutritional and metabolic disease: Secondary | ICD-10-CM | POA: Diagnosis not present

## 2022-02-03 DIAGNOSIS — I10 Essential (primary) hypertension: Secondary | ICD-10-CM | POA: Diagnosis not present

## 2022-02-09 DIAGNOSIS — E892 Postprocedural hypoparathyroidism: Secondary | ICD-10-CM | POA: Diagnosis not present

## 2022-02-09 DIAGNOSIS — E210 Primary hyperparathyroidism: Secondary | ICD-10-CM | POA: Diagnosis not present

## 2022-04-10 DIAGNOSIS — Z23 Encounter for immunization: Secondary | ICD-10-CM | POA: Diagnosis not present

## 2022-04-15 ENCOUNTER — Ambulatory Visit (INDEPENDENT_AMBULATORY_CARE_PROVIDER_SITE_OTHER): Payer: Self-pay

## 2022-04-18 ENCOUNTER — Emergency Department
Admission: EM | Admit: 2022-04-18 | Discharge: 2022-04-18 | Disposition: A | Discharge status: Home / Self Care | Payer: BC Managed Care – PPO | Attending: Emergency Medicine | Admitting: Emergency Medicine

## 2022-04-18 ENCOUNTER — Emergency Department (HOSPITAL_COMMUNITY): Payer: BC Managed Care – PPO

## 2022-04-18 ENCOUNTER — Other Ambulatory Visit: Payer: Self-pay

## 2022-04-18 ENCOUNTER — Encounter (HOSPITAL_COMMUNITY): Payer: Self-pay | Admitting: Emergency Medicine

## 2022-04-18 DIAGNOSIS — R059 Cough, unspecified: Secondary | ICD-10-CM | POA: Insufficient documentation

## 2022-04-18 DIAGNOSIS — U071 COVID-19: Secondary | ICD-10-CM | POA: Insufficient documentation

## 2022-04-18 DIAGNOSIS — I4510 Unspecified right bundle-branch block: Secondary | ICD-10-CM | POA: Insufficient documentation

## 2022-04-18 DIAGNOSIS — R911 Solitary pulmonary nodule: Secondary | ICD-10-CM | POA: Insufficient documentation

## 2022-04-18 DIAGNOSIS — R0781 Pleurodynia: Secondary | ICD-10-CM | POA: Insufficient documentation

## 2022-04-18 DIAGNOSIS — R0602 Shortness of breath: Secondary | ICD-10-CM | POA: Insufficient documentation

## 2022-04-18 DIAGNOSIS — D72829 Elevated white blood cell count, unspecified: Secondary | ICD-10-CM | POA: Insufficient documentation

## 2022-04-18 DIAGNOSIS — R001 Bradycardia, unspecified: Secondary | ICD-10-CM | POA: Insufficient documentation

## 2022-04-18 LAB — CBC WITH DIFF
BASOPHIL #: <0.1 10*3/uL (ref <=0.20)
BASOPHIL %: 1 %
EOSINOPHIL #: 0.58 10*3/uL — ABNORMAL HIGH (ref <=0.50)
EOSINOPHIL %: 5 %
HCT: 38.3 % (ref 34.8–46.0)
HGB: 11.9 g/dL (ref 11.5–16.0)
IMMATURE GRANULOCYTE #: <0.1 10*3/uL (ref <0.10)
IMMATURE GRANULOCYTE %: 1 % (ref 0–1)
LYMPHOCYTE #: 2.73 10*3/uL (ref 1.00–4.80)
LYMPHOCYTE %: 22 %
MCH: 28.5 pg (ref 26.0–32.0)
MCHC: 31.1 g/dL (ref 31.0–35.5)
MCV: 91.8 fL (ref 78.0–100.0)
MONOCYTE #: 1.24 10*3/uL — ABNORMAL HIGH (ref 0.20–1.10)
MONOCYTE %: 10 %
MPV: 11.9 fL (ref 8.7–12.5)
NEUTROPHIL #: 7.44 10*3/uL (ref 1.50–7.70)
NEUTROPHIL %: 61 %
PLATELETS: 244 10*3/uL (ref 150–400)
RBC: 4.17 10*6/uL (ref 3.85–5.22)
RDW-CV: 13.6 % (ref 11.5–15.5)
WBC: 12.2 10*3/uL — ABNORMAL HIGH (ref 3.7–11.0)

## 2022-04-18 LAB — ECG 12 LEAD
Atrial Rate: 57 {beats}/min
Calculated P Axis: 24 degrees
Calculated R Axis: 43 degrees
Calculated T Axis: 73 degrees
PR Interval: 140 ms
QRS Duration: 94 ms
QT Interval: 416 ms
QTC Calculation: 404 ms
Ventricular rate: 57 {beats}/min

## 2022-04-18 LAB — BASIC METABOLIC PANEL
ANION GAP: 10 mmol/L (ref 4–13)
BUN/CREA RATIO: 23 — ABNORMAL HIGH (ref 6–22)
BUN: 22 mg/dL (ref 8–25)
CALCIUM: 9.7 mg/dL (ref 8.6–10.2)
CHLORIDE: 105 mmol/L (ref 96–111)
CO2 TOTAL: 27 mmol/L (ref 22–30)
CREATININE: 0.94 mg/dL (ref 0.60–1.05)
ESTIMATED GFR - FEMALE: 70 mL/min/BSA (ref >=60)
GLUCOSE: 79 mg/dL (ref 65–125)
POTASSIUM: 3.7 mmol/L (ref 3.5–5.1)
SODIUM: 142 mmol/L (ref 136–145)

## 2022-04-18 LAB — TROPONIN-I: TROPONIN-I HS: <2.7 ng/L (ref <=14.0)

## 2022-04-18 LAB — COVID-19 ~~LOC~~ MOLECULAR LAB TESTING
INFLUENZA VIRUS TYPE A: NOT DETECTED
INFLUENZA VIRUS TYPE B: NOT DETECTED
SARS-COV-2: DETECTED — CR

## 2022-04-18 LAB — B-TYPE NATRIURETIC PEPTIDE: BNP: 70 pg/mL (ref <=99)

## 2022-04-18 MED ORDER — IOMEPROL 400 MG IODINE/ML INTRAVENOUS SOLUTION
100.0000 mL | INTRAVENOUS | Status: AC
Start: 2022-04-18 — End: 2022-04-18
  Administered 2022-04-18: 100 mL via INTRAVENOUS

## 2022-04-18 MED ORDER — HYDROCODONE-HOMATROPINE 5 MG-1.5 MG/5 ML ORAL SYRUP
5.0000 mL | ORAL_SOLUTION | Freq: Four times a day (QID) | ORAL | 0 refills | Status: AC | PRN
Start: 2022-04-18 — End: 2022-04-25

## 2022-04-18 MED ORDER — METHOCARBAMOL 500 MG TABLET
1000.0000 mg | ORAL_TABLET | Freq: Three times a day (TID) | ORAL | 0 refills | Status: AC
Start: 2022-04-18 — End: 2022-04-28

## 2022-04-18 MED ORDER — SODIUM CHLORIDE 0.9 % IV BOLUS
1000.0000 mL | INJECTION | Status: AC
Start: 2022-04-18 — End: 2022-04-18
  Administered 2022-04-18: 0 mL via INTRAVENOUS
  Administered 2022-04-18: 1000 mL via INTRAVENOUS

## 2022-04-18 MED ORDER — BENZONATATE 100 MG CAPSULE
100.0000 mg | ORAL_CAPSULE | Freq: Three times a day (TID) | ORAL | 0 refills | Status: DC | PRN
Start: 2022-04-18 — End: 2022-10-30

## 2022-04-18 MED ORDER — KETOROLAC 30 MG/ML (1 ML) INJECTION SOLUTION
30.0000 mg | INTRAMUSCULAR | Status: AC
Start: 2022-04-18 — End: 2022-04-18
  Administered 2022-04-18: 30 mg via INTRAVENOUS
  Filled 2022-04-18: qty 1

## 2022-04-18 NOTE — ED Nurses Note (Signed)
Patient discharged home with family.  AVS reviewed with patient/care giver.  A written copy of the AVS and discharge instructions was given to the patient/care giver.  Questions sufficiently answered as needed.  Patient/care giver encouraged to follow up with PCP as indicated.  In the event of an emergency, patient/care giver instructed to call 911 or go to the nearest emergency room.

## 2022-04-18 NOTE — ED Provider Notes (Signed)
[facility]  ED Primary Provider Note  History of Present Illness   Chief Complaint   Patient presents with    Cough     Suzanne Olson is a 56 y.o. female who had concerns including Cough.  Arrival: The patient arrived by Car    Patient states that over the past 6 weeks she has been having an ongoing cough, now having soreness to her ribs and chest.  She states she continues to produce thick sputum.  She states she was evaluated twice at urgent care where she was given steroids and antibiotics.  She states that the cough is no better, now feeling short of breath.  She denies any fevers, no body aches, no nausea/vomiting/diarrhea.      History provided by:  Patient    Review of Systems   Pertinent positive and negative ROS as per HPI.  Historical Data   History Reviewed This Encounter: Medical History  Surgical History  Family History  Social History    Physical Exam   ED Triage Vitals [04/18/22 0757]   BP (Non-Invasive) (!) 153/97   Heart Rate 62   Respiratory Rate 20   Temperature (!) 35.9 C (96.6 F)   SpO2 96 %   Weight 83.9 kg (185 lb)   Height 1.753 m ('5\' 9"'$ )     Physical Exam  Vitals and nursing note reviewed.   Constitutional:       Appearance: Normal appearance.   HENT:      Head: Normocephalic and atraumatic.      Right Ear: Tympanic membrane normal.      Left Ear: Tympanic membrane normal.      Nose: Nose normal.      Mouth/Throat:      Mouth: Mucous membranes are moist.      Pharynx: Oropharynx is clear.   Eyes:      Extraocular Movements: Extraocular movements intact.      Pupils: Pupils are equal, round, and reactive to light.   Cardiovascular:      Rate and Rhythm: Normal rate and regular rhythm.      Heart sounds: No murmur heard.     No friction rub. No gallop.   Pulmonary:      Effort: Pulmonary effort is normal. No respiratory distress.      Breath sounds: Normal breath sounds.   Abdominal:      General: Bowel sounds are normal. There is no distension.      Palpations:  Abdomen is soft.      Tenderness: There is no abdominal tenderness.   Musculoskeletal:         General: Normal range of motion.      Cervical back: Normal range of motion and neck supple.   Skin:     General: Skin is warm and dry.   Neurological:      General: No focal deficit present.      Mental Status: She is alert and oriented to person, place, and time.   Psychiatric:         Mood and Affect: Mood normal.         Behavior: Behavior normal.       Patient Data     Labs Ordered/Reviewed   BASIC METABOLIC PANEL - Abnormal; Notable for the following components:       Result Value    BUN/CREA RATIO 23 (*)     All other components within normal limits   COVID-19 [place] [facility]  TESTING - Abnormal; Notable for the following components:    SARS-COV-2 Detected (*)     All other components within normal limits    Narrative:     Results are for the simultaneous qualitative identification of SARS-CoV-2 (formerly 2019-nCoV), Influenza A and Influenza B RNA. These etiologic agents are generally detectable in nasopharyngeal and nasal swabs during the ACUTE PHASE of infection. Hence, this test is intended to be performed on respiratory specimens collected from individuals with signs and symptoms of upper respiratory tract infection who meet Centers for Disease Control and Prevention (CDC) clinical and/or epidemiological criteria for Coronavirus Disease 2019 (COVID-19) testing. CDC COVID-19 criteria for testing on human specimens is available at [facility] webpage information for Healthcare Professionals: Coronavirus Disease 2019 (COVID-19) ([employer]).     False-negative results may occur if the virus has genomic mutations, insertions, deletions, or rearrangements or if performed very early in the course of illness. Otherwise, negative results indicate virus specific RNA targets are not detected; however, negative results do not preclude SARS-CoV-2 (COVID-19) infection or Influenza A  or B infection. Results should not be used as the sole basis for patient management decisions. Negative results must be combined with clinical observations, patient history, and epidemiological information. If upper respiratory tract infection is still suspected based on exposure history together with other clinical findings, repeat testing should be considered.     Disclaimer:   The impacts of vaccines, antiviral therapeutics, antibiotics, chemotherapeutic or immunosuppressant drugs have not been evaluated.     Test methodology:   Cobas SARS-CoV-2 and Influenza A/B Assay - real-time reverse transcriptase polymerase chain reaction (RT-PCR) test on the cobas LIAT analyzer [employer]).     Cobas SARS-CoV-2 & Influenza A/B assay uses real-time reverse transcriptase polymerase chain reaction (RT-PCR) technology to rapidly (approximately 20 minutes) detect and differentiate between SARS-CoV-2, influenza A, and influenza B viruses from nasopharyngeal and nasal swabs.     CBC WITH DIFF - Abnormal; Notable for the following components:    WBC 12.2 (*)     MONOCYTE # 1.24 (*)     EOSINOPHIL # 0.58 (*)     All other components within normal limits   B-TYPE NATRIURETIC PEPTIDE - Normal   TROPONIN-I - Normal   CBC/DIFF    Narrative:     The following orders were created for panel order CBC/DIFF.  Procedure                               Abnormality         Status                     ---------                               -----------         ------                     CBC WITH VWUJ[[id number]]                Abnormal            Final result                 Please view results for these tests on the individual orders.     CT CHEST FOR PULMONARY EMBOLUS W IV CONTRAST   Final Result  by Edi, Radresults In (10/22 1001)   No pulmonary artery emboli.      Increased size of subcarinal lymph node.  Recommend follow-up images for further evaluation.  No other acute cardia pulmonary disease.      Underlying emphysematous changes.       4 mm pulmonary nodule left lower lobe appears stable.         Signed by Phillip Heal, DO        Medical Decision Making        Medical Decision Making  56 year old female presenting with ongoing cough.  She has no underlying lung disease.  Would have concern for viral or bacterial respiratory illness.  Lower suspicion for cardiac etiology, CHF or PE.  She arrives hemodynamically stable, no increased work of breathing or hypoxia, afebrile.  She has clear lungs on exam.  EKG shows no findings suggestive of acute ischemia or injury.  Troponin and BNP are unremarkable.  Leukocytosis 12.2, no anemia.  Metabolic panel is within acceptable limits.  COVID testing is positive.  CTA chest shows no PE, no infiltrate or consolidation.  Patient is resting comfortably on reassessment, no increased work of breathing or hypoxia.  She is comfortable with discharge, given Tessalon for cough, encouraged to use ibuprofen and Tylenol, rest and hydrate.  She states she is still having soreness from coughing, may benefit from muscle relaxer which I prescribed as well.  PCP follow-up with instructions to return for any changes in condition or new symptoms.    Amount and/or Complexity of Data Reviewed  Labs: ordered. Decision-making details documented in ED Course.  Radiology: ordered. Decision-making details documented in ED Course.  ECG/medicine tests: ordered and independent interpretation performed. Decision-making details documented in ED Course.    Risk  Prescription drug management.      ED Course as of 04/18/22 1057   Sun Apr 18, 2022   0838 EKG:  Sinus bradycardia 57, normal axis, incomplete right bundle branch block, no ST-T changes.  No findings suggestive of acute ischemia or injury.  No significant changes compared to prior EKG.           Medications Administered in the ED   NS bolus infusion 1,000 mL (0 mL Intravenous Stopped 04/18/22 0856)   iomeprol (IOMERON 400) infusion (100 mL Intravenous Given 04/18/22 0927)    ketorolac (TORADOL) 30 mg/mL injection (30 mg Intravenous Given 04/18/22 1014)     Clinical Impression   COVID-19 virus infection (Primary)       Disposition: Discharged

## 2022-04-18 NOTE — ED Triage Notes (Signed)
PT C/O COUGH WITH YELLOW PHLEGM X 6 WEEKS. SEEN AT MED EXP X2 GIVEN 2 ROUNDS OF STEROIDS AND ABX, NO IMPROVEMENT.

## 2022-04-21 ENCOUNTER — Telehealth (HOSPITAL_COMMUNITY): Payer: Self-pay

## 2022-04-28 ENCOUNTER — Other Ambulatory Visit (HOSPITAL_COMMUNITY): Payer: Self-pay | Admitting: NURSE PRACTITIONER

## 2022-04-28 ENCOUNTER — Inpatient Hospital Stay
Admission: RE | Admit: 2022-04-28 | Discharge: 2022-04-28 | Disposition: A | Discharge status: Home / Self Care | Payer: BC Managed Care – PPO | Source: Ambulatory Visit | Attending: NURSE PRACTITIONER | Admitting: NURSE PRACTITIONER

## 2022-04-28 ENCOUNTER — Inpatient Hospital Stay (HOSPITAL_BASED_OUTPATIENT_CLINIC_OR_DEPARTMENT_OTHER)
Admission: RE | Admit: 2022-04-28 | Discharge: 2022-04-28 | Disposition: A | Discharge status: Home / Self Care | Payer: BC Managed Care – PPO | Source: Ambulatory Visit | Attending: NURSE PRACTITIONER | Admitting: NURSE PRACTITIONER

## 2022-04-28 ENCOUNTER — Other Ambulatory Visit: Payer: Self-pay

## 2022-04-28 DIAGNOSIS — U071 COVID-19: Secondary | ICD-10-CM

## 2022-04-28 DIAGNOSIS — R52 Pain, unspecified: Secondary | ICD-10-CM

## 2022-05-07 ENCOUNTER — Ambulatory Visit: Payer: BC Managed Care – PPO | Admitting: Internal Medicine

## 2022-05-18 DIAGNOSIS — I10 Essential (primary) hypertension: Secondary | ICD-10-CM | POA: Diagnosis not present

## 2022-05-18 DIAGNOSIS — Z8679 Personal history of other diseases of the circulatory system: Secondary | ICD-10-CM | POA: Diagnosis not present

## 2022-05-18 DIAGNOSIS — Z8639 Personal history of other endocrine, nutritional and metabolic disease: Secondary | ICD-10-CM | POA: Diagnosis not present

## 2022-05-18 DIAGNOSIS — E785 Hyperlipidemia, unspecified: Secondary | ICD-10-CM | POA: Diagnosis not present

## 2022-06-01 DIAGNOSIS — Q210 Ventricular septal defect: Secondary | ICD-10-CM | POA: Diagnosis not present

## 2022-06-01 DIAGNOSIS — I4719 Other supraventricular tachycardia: Secondary | ICD-10-CM | POA: Diagnosis not present

## 2022-06-01 DIAGNOSIS — I341 Nonrheumatic mitral (valve) prolapse: Secondary | ICD-10-CM | POA: Diagnosis not present

## 2022-06-17 ENCOUNTER — Other Ambulatory Visit (HOSPITAL_COMMUNITY): Payer: Self-pay | Admitting: NURSE PRACTITIONER

## 2022-06-17 DIAGNOSIS — Z1231 Encounter for screening mammogram for malignant neoplasm of breast: Secondary | ICD-10-CM

## 2022-06-24 DIAGNOSIS — Z6825 Body mass index (BMI) 25.0-25.9, adult: Secondary | ICD-10-CM | POA: Diagnosis not present

## 2022-06-24 DIAGNOSIS — Z01419 Encounter for gynecological examination (general) (routine) without abnormal findings: Secondary | ICD-10-CM | POA: Diagnosis not present

## 2022-06-24 DIAGNOSIS — Z124 Encounter for screening for malignant neoplasm of cervix: Secondary | ICD-10-CM | POA: Diagnosis not present

## 2022-07-14 ENCOUNTER — Other Ambulatory Visit: Payer: Self-pay

## 2022-07-14 ENCOUNTER — Other Ambulatory Visit: Payer: BC Managed Care – PPO | Attending: GYNECOLOGICAL/ONCOLOGY

## 2022-07-14 DIAGNOSIS — N903 Dysplasia of vulva, unspecified: Secondary | ICD-10-CM

## 2022-07-14 LAB — COMPREHENSIVE METABOLIC PNL, FASTING
ALBUMIN: 3.8 g/dL (ref 3.5–5.0)
ALKALINE PHOSPHATASE: 62 U/L (ref 50–130)
ALT (SGPT): 13 U/L (ref 8–22)
ANION GAP: 6 mmol/L (ref 4–13)
AST (SGOT): 19 U/L (ref 8–45)
BILIRUBIN TOTAL: 0.4 mg/dL (ref 0.3–1.3)
BUN/CREA RATIO: 24 — ABNORMAL HIGH (ref 6–22)
BUN: 21 mg/dL (ref 8–25)
CALCIUM: 9.9 mg/dL (ref 8.6–10.2)
CHLORIDE: 109 mmol/L (ref 96–111)
CO2 TOTAL: 26 mmol/L (ref 22–30)
CREATININE: 0.88 mg/dL (ref 0.60–1.05)
ESTIMATED GFR - FEMALE: 76 mL/min/BSA (ref >=60)
GLUCOSE: 125 mg/dL — ABNORMAL HIGH (ref 70–99)
POTASSIUM: 4.2 mmol/L (ref 3.5–5.1)
PROTEIN TOTAL: 7.5 g/dL (ref 6.4–8.3)
SODIUM: 141 mmol/L (ref 136–145)

## 2022-07-14 LAB — CBC WITH DIFF
BASOPHIL #: <0.1 10*3/uL (ref <=0.20)
BASOPHIL %: 1 %
EOSINOPHIL #: 0.2 10*3/uL (ref <=0.50)
EOSINOPHIL %: 3 %
HCT: 40.9 % (ref 34.8–46.0)
HGB: 12.6 g/dL (ref 11.5–16.0)
IMMATURE GRANULOCYTE #: <0.1 10*3/uL (ref <0.10)
IMMATURE GRANULOCYTE %: 0 % (ref 0.0–1.0)
LYMPHOCYTE #: 1.58 10*3/uL (ref 1.00–4.80)
LYMPHOCYTE %: 25 %
MCH: 28.6 pg (ref 26.0–32.0)
MCHC: 30.8 g/dL — ABNORMAL LOW (ref 31.0–35.5)
MCV: 92.7 fL (ref 78.0–100.0)
MONOCYTE #: 0.48 10*3/uL (ref 0.20–1.10)
MONOCYTE %: 8 %
MPV: 12.9 fL — ABNORMAL HIGH (ref 8.7–12.5)
NEUTROPHIL #: 4.08 10*3/uL (ref 1.50–7.70)
NEUTROPHIL %: 63 %
PLATELETS: 219 10*3/uL (ref 150–400)
RBC: 4.41 10*6/uL (ref 3.85–5.22)
RDW-CV: 13.2 % (ref 11.5–15.5)
WBC: 6.4 10*3/uL (ref 3.7–11.0)

## 2022-07-27 DIAGNOSIS — N390 Urinary tract infection, site not specified: Secondary | ICD-10-CM | POA: Diagnosis not present

## 2022-08-23 ENCOUNTER — Encounter (HOSPITAL_BASED_OUTPATIENT_CLINIC_OR_DEPARTMENT_OTHER): Payer: Self-pay

## 2022-08-23 ENCOUNTER — Other Ambulatory Visit: Payer: Self-pay

## 2022-08-23 ENCOUNTER — Inpatient Hospital Stay
Admission: RE | Admit: 2022-08-23 | Discharge: 2022-08-23 | Disposition: A | Discharge status: Home / Self Care | Payer: BC Managed Care – PPO | Source: Ambulatory Visit | Attending: NURSE PRACTITIONER | Admitting: NURSE PRACTITIONER

## 2022-08-23 DIAGNOSIS — Z1231 Encounter for screening mammogram for malignant neoplasm of breast: Secondary | ICD-10-CM | POA: Insufficient documentation

## 2022-10-30 ENCOUNTER — Encounter (HOSPITAL_COMMUNITY): Payer: Self-pay

## 2022-10-30 ENCOUNTER — Emergency Department (HOSPITAL_COMMUNITY): Payer: BC Managed Care – PPO

## 2022-10-30 ENCOUNTER — Emergency Department
Admission: EM | Admit: 2022-10-30 | Discharge: 2022-10-30 | Disposition: A | Discharge status: Home / Self Care | Payer: BC Managed Care – PPO | Attending: Emergency Medicine | Admitting: Emergency Medicine

## 2022-10-30 ENCOUNTER — Other Ambulatory Visit: Payer: Self-pay

## 2022-10-30 DIAGNOSIS — R079 Chest pain, unspecified: Secondary | ICD-10-CM

## 2022-10-30 DIAGNOSIS — I440 Atrioventricular block, first degree: Secondary | ICD-10-CM

## 2022-10-30 DIAGNOSIS — K219 Gastro-esophageal reflux disease without esophagitis: Secondary | ICD-10-CM

## 2022-10-30 HISTORY — DX: Arthropathy, unspecified: M12.9

## 2022-10-30 HISTORY — DX: Disorder of thyroid, unspecified: E07.9

## 2022-10-30 HISTORY — DX: Essential (primary) hypertension: I10

## 2022-10-30 LAB — CBC WITH DIFF
BASOPHIL #: <0.1 10*3/uL (ref <=0.20)
BASOPHIL %: 1 %
EOSINOPHIL #: 0.28 10*3/uL (ref <=0.50)
EOSINOPHIL %: 3 %
HCT: 38.1 % (ref 34.8–46.0)
HGB: 12.4 g/dL (ref 11.5–16.0)
IMMATURE GRANULOCYTE #: <0.1 10*3/uL (ref <0.10)
IMMATURE GRANULOCYTE %: 0 % (ref 0.0–1.0)
LYMPHOCYTE #: 1.79 10*3/uL (ref 1.00–4.80)
LYMPHOCYTE %: 19 %
MCH: 29.2 pg (ref 26.0–32.0)
MCHC: 32.5 g/dL (ref 31.0–35.5)
MCV: 89.6 fL (ref 78.0–100.0)
MONOCYTE #: 0.78 10*3/uL (ref 0.20–1.10)
MONOCYTE %: 8 %
MPV: 12.2 fL (ref 8.7–12.5)
NEUTROPHIL #: 6.35 10*3/uL (ref 1.50–7.70)
NEUTROPHIL %: 69 %
PLATELETS: 222 10*3/uL (ref 150–400)
RBC: 4.25 10*6/uL (ref 3.85–5.22)
RDW-CV: 13.2 % (ref 11.5–15.5)
WBC: 9.3 10*3/uL (ref 3.7–11.0)

## 2022-10-30 LAB — ECG 12 LEAD
Atrial Rate: 64 {beats}/min
Calculated P Axis: 60 degrees
Calculated R Axis: 12 degrees
Calculated T Axis: 52 degrees
PR Interval: 212 ms
QRS Duration: 92 ms
QT Interval: 400 ms
QTC Calculation: 412 ms
Ventricular rate: 64 {beats}/min

## 2022-10-30 LAB — URINALYSIS, MACRO/MICRO
BILIRUBIN: NEGATIVE mg/dL
BLOOD: NEGATIVE mg/dL
GLUCOSE: NEGATIVE mg/dL
KETONES: NEGATIVE mg/dL
LEUKOCYTES: NEGATIVE WBCs/uL
NITRITE: NEGATIVE
PH: 5.5 (ref 5.0–9.0)
PROTEIN: NEGATIVE mg/dL
SPECIFIC GRAVITY: 1.008 (ref 1.001–1.030)
UROBILINOGEN: 0.2 mg/dL (ref 0.2–1.0)

## 2022-10-30 LAB — COMPREHENSIVE METABOLIC PANEL, NON-FASTING
ALBUMIN: 3.9 g/dL (ref 3.5–5.0)
ALKALINE PHOSPHATASE: 63 U/L (ref 50–130)
ALT (SGPT): 11 U/L (ref 8–22)
ANION GAP: 11 mmol/L (ref 4–13)
AST (SGOT): 18 U/L (ref 8–45)
BILIRUBIN TOTAL: 0.3 mg/dL (ref 0.3–1.3)
BUN/CREA RATIO: 28 — ABNORMAL HIGH (ref 6–22)
BUN: 41 mg/dL — ABNORMAL HIGH (ref 8–25)
CALCIUM: 9.6 mg/dL (ref 8.6–10.2)
CHLORIDE: 107 mmol/L (ref 96–111)
CO2 TOTAL: 22 mmol/L (ref 22–30)
CREATININE: 1.45 mg/dL — ABNORMAL HIGH (ref 0.60–1.05)
ESTIMATED GFR - FEMALE: 42 mL/min/BSA — ABNORMAL LOW (ref >=60)
GLUCOSE: 109 mg/dL (ref 65–125)
POTASSIUM: 4.1 mmol/L (ref 3.5–5.1)
PROTEIN TOTAL: 7.8 g/dL (ref 6.4–8.3)
SODIUM: 140 mmol/L (ref 136–145)

## 2022-10-30 LAB — TROPONIN-I: TROPONIN-I HS: <2.7 ng/L (ref <=14.0)

## 2022-10-30 LAB — PT/INR
INR: 1.05 (ref 0.90–1.10)
PROTHROMBIN TIME: 11.4 seconds (ref 9.0–13.0)

## 2022-10-30 LAB — CREATINE KINASE (CK), TOTAL, SERUM OR PLASMA: CREATINE KINASE: 106 U/L (ref 25–190)

## 2022-10-30 LAB — PTT (PARTIAL THROMBOPLASTIN TIME): APTT: 28.4 seconds (ref 23.0–32.0)

## 2022-10-30 LAB — LIPASE: LIPASE: 26 U/L (ref 10–60)

## 2022-10-30 LAB — MAGNESIUM: MAGNESIUM: 1.7 mg/dL — ABNORMAL LOW (ref 1.8–2.6)

## 2022-10-30 LAB — RED TOP TUBE

## 2022-10-30 LAB — AMYLASE: AMYLASE: 54 U/L (ref 25–125)

## 2022-10-30 MED ORDER — ONDANSETRON 4 MG DISINTEGRATING TABLET
4.0000 mg | ORAL_TABLET | Freq: Three times a day (TID) | ORAL | 0 refills | Status: AC | PRN
Start: 2022-10-30 — End: ?

## 2022-10-30 MED ORDER — ONDANSETRON HCL (PF) 4 MG/2 ML INJECTION SOLUTION
4.0000 mg | INTRAMUSCULAR | Status: AC
Start: 2022-10-30 — End: 2022-10-30
  Administered 2022-10-30: 4 mg via INTRAVENOUS
  Filled 2022-10-30: qty 2

## 2022-10-30 MED ORDER — SODIUM CHLORIDE 0.9 % IV BOLUS
1000.0000 mL | INJECTION | Status: AC
Start: 2022-10-30 — End: 2022-10-30
  Administered 2022-10-30: 0 mL via INTRAVENOUS
  Administered 2022-10-30: 1000 mL via INTRAVENOUS

## 2022-10-30 MED ORDER — ALUMINUM-MAG HYDROXIDE-SIMETHICONE 400 MG-400 MG-40 MG/5 ML ORAL SUSP
20.0000 mL | ORAL | Status: AC
Start: 2022-10-30 — End: 2022-10-30
  Administered 2022-10-30: 20 mL via ORAL
  Filled 2022-10-30 (×2): qty 30

## 2022-10-30 MED ORDER — PANTOPRAZOLE 40 MG TABLET,DELAYED RELEASE
40.0000 mg | DELAYED_RELEASE_TABLET | Freq: Every day | ORAL | 0 refills | Status: AC
Start: 2022-10-30 — End: 2023-01-28

## 2022-10-30 NOTE — ED Provider Notes (Signed)
[facility] - Emergency Department      Attending Physician: Dr. Emeline Gins  CC:  Chief Complaint   Patient presents with    Rib Pain     Patient seen immediately on arrival to the emergency room.  HPI:  Suzanne Olson is a 57 y.o. female who presents to the Emergency Room with c/o burning pain in epigastric area going into her chest associated with the gas and burping.  Patient said that she has been taking Tums sometimes they helped sometimes they do not.  The he is also complaining pain under her left press is nonradiating no shortness of breath or sweating or dizziness.  No history of cardiac condition but she has history of hypertension and hypercholesteremia does not smoke no diabetes.  Status post appendectomy.    Review of Systems:  Constitutional:  Constitutional symptoms negative except what describe in history.  Skin:  Negative except what Idescribed in his history.  HENT:Negative except what I described in his history.  Eyes: Negative except what a described in his history.  Cardio: Negative except what I described in his history.  Respiratory:Negative except what I described in his history.  GI: Negative except what I a described in his history.  GU: Negative except what I described in his history.  MSK: Negative except what I described in his history.  Neuro: Negative except what I described in his history.   All other systems reviewed and are negative or as noted in HPI.    History:  PMH:    Past Medical History:   Diagnosis Date    Arthropathy     Cancer (CMS HCC)     HTN (hypertension)     Thyroid disease          Previous Medications    ATENOLOL (TENORMIN) 100 MG ORAL TABLET    Take 1 Tablet (100 mg total) by mouth Once a day    BUSPIRONE (BUSPAR) 7.5 MG ORAL TABLET    Take 1 Tablet (7.5 mg total) by mouth Twice daily    LEVOTHYROXINE (SYNTHROID) 75 MCG ORAL TABLET    1 Tablet (75 mcg total) Once a day    MELOXICAM (MOBIC) 15 MG ORAL TABLET    Take 1 Tablet (15 mg total) by mouth Once a day     OLMESARTAN (BENICAR) 40 MG ORAL TABLET    Take 1 Tablet (40 mg total) by mouth Once a day       PSH:    Past Surgical History:   Procedure Laterality Date    HX BREAST BIOPSY Left          Social Hx:    Social History     Socioeconomic History    Marital status: Married     Spouse name: Not on file    Number of children: Not on file    Years of education: Not on file    Highest education level: Not on file   Occupational History    Not on file   Tobacco Use    Smoking status: Former     Types: Cigarettes    Smokeless tobacco: Never   Vaping Use    Vaping status: Never Used   Substance and Sexual Activity    Alcohol use: Not Currently    Drug use: Not Currently    Sexual activity: Not on file   Other Topics Concern    Not on file   Social History Narrative    Not on file  Social Determinants of Health     Financial Resource Strain: Not on file   Transportation Needs: Not on file   Social Connections: Not on file   Intimate Partner Violence: Not on file   Housing Stability: Not on file     Family Hx:   Family History   Problem Relation Age of Onset    Atrial fibrillation Mother     Breast Cancer Paternal Aunt     Breast Cancer Paternal Aunt     Breast Cancer Paternal Aunt     Breast Cancer Paternal Aunt     Breast Cancer Paternal cousin      Allergies: No Known Allergies    Above history reviewed with patient, changes are as documented.    Physical Exam:   Nursing note and vitals reviewed.  ED Triage Vitals [10/30/22 1734]   BP (Non-Invasive) 132/78   Heart Rate 65   Respiratory Rate 18   Temperature 36.5 C (97.7 F)   SpO2 99 %   Weight 85.3 kg (188 lb)   Height 1.753 m (5\' 9" )       Constitutional: Pt is alert and oriented and appears well-developed and well-nourished. No acute distress.   Head.  Atraumatic.  Pupils.  Equal and reacting to light.  HENT:   Mouth/Throat: Oropharynx is clear and moist.  Tonsils not enlarged no inflammation.  Eyes: Conjunctivae and extraocular motions are normal. Pupils are equal,  round, and reactive to light.   Neck: Normal range of motion. Neck supple.  No JVDs or bruits trachea midline  Cardiovascular:  Heart S1-S2 regular no gallop or murmur.  Normal rate, regular rhythm and intact distal pulses.    Pulmonary/Chest: Effort normal. No respiratory distress. Breath sounds normal.  Lungs are clear no rales rhonchi or wheezing.  Abdominal: Abdomen is soft, nontender, nondistended.  No rebound or guarding noted.  Bowel sounds are present.  Liver spleen and kidneys are not palpable.  Musculoskeletal: Normal range of motion.  Examination of lower extremities shows no edema.  Neurological: Pt is alert and oriented. Grossly intact. Moving all extremities. Facies symmetric.  No motor or sensory deficits.  Skin: Skin is warm and dry without rash.  Color normal.      Course:   Impression: Pt presenting c/o   Chief Complaint   Patient presents with    Rib Pain       Plan:Will obtain the following labs/imaging and give patient the following medications to alleviate symptoms:  Orders Placed This Encounter    XR AP MOBILE CHEST    CANCELED: CT ABDOMEN PELVIS W IV CONTRAST    CT ABDOMEN PELVIS WO IV CONTRAST    CBC/DIFF    COMPREHENSIVE METABOLIC PANEL, NON-FASTING    MAGNESIUM    CREATINE KINASE (CK), TOTAL, SERUM    PT/INR    PTT (PARTIAL THROMBOPLASTIN TIME)    TROPONIN-I    URINALYSIS WITH REFLEX MICROSCOPIC AND CULTURE IF POSITIVE    AMYLASE    LIPASE    CBC WITH DIFF    URINALYSIS, MACRO/MICRO    EXTRA TUBES    RED TOP TUBE    ECG 12 LEAD    NS bolus infusion 1,000 mL    ondansetron (ZOFRAN) 2 mg/mL injection    aluminum-magnesium hydroxide-simethicone (MAALOX MAX) 400-400-40mg  per 5mL oral liquid    ondansetron (ZOFRAN) 2 mg/mL injection    pantoprazole (PROTONIX) 40 mg Oral Tablet, Delayed Release (E.C.)    ondansetron (ZOFRAN ODT) 4 mg Oral Tablet,  Rapid Dissolve     Laboratory Results:    Results for orders placed or performed during the hospital encounter of 10/30/22 (from the past 12 hour(s))    COMPREHENSIVE METABOLIC PANEL, NON-FASTING   Result Value Ref Range    SODIUM 140 136 - 145 mmol/L    POTASSIUM 4.1 3.5 - 5.1 mmol/L    CHLORIDE 107 96 - 111 mmol/L    CO2 TOTAL 22 22 - 30 mmol/L    ANION GAP 11 4 - 13 mmol/L    BUN 41 (H) 8 - 25 mg/dL    CREATININE 9.52 (H) 0.60 - 1.05 mg/dL    BUN/CREA RATIO 28 (H) 6 - 22    ALBUMIN 3.9 3.5 - 5.0 g/dL     CALCIUM 9.6 8.6 - 84.1 mg/dL    GLUCOSE 324 65 - 401 mg/dL    ALKALINE PHOSPHATASE 63 50 - 130 U/L    ALT (SGPT) 11 8 - 22 U/L    AST (SGOT)  18 8 - 45 U/L    BILIRUBIN TOTAL 0.3 0.3 - 1.3 mg/dL    PROTEIN TOTAL 7.8 6.4 - 8.3 g/dL    ESTIMATED GFR - FEMALE 42 (L) >=60 mL/min/BSA   MAGNESIUM   Result Value Ref Range    MAGNESIUM 1.7 (L) 1.8 - 2.6 mg/dL   CREATINE KINASE (CK), TOTAL, SERUM   Result Value Ref Range    CREATINE KINASE 106 25 - 190 U/L   PT/INR   Result Value Ref Range    PROTHROMBIN TIME 11.4 9.0 - 13.0 seconds    INR 1.05 0.90 - 1.10   PTT (PARTIAL THROMBOPLASTIN TIME)   Result Value Ref Range    APTT 28.4 23.0 - 32.0 seconds   TROPONIN-I   Result Value Ref Range    TROPONIN-I HS <2.7 <=14.0 ng/L ng/L   AMYLASE   Result Value Ref Range    AMYLASE 54 25 - 125 U/L   LIPASE   Result Value Ref Range    LIPASE 26 10 - 60 U/L   CBC WITH DIFF   Result Value Ref Range    WBC 9.3 3.7 - 11.0 x10^3/uL    RBC 4.25 3.85 - 5.22 x10^6/uL    HGB 12.4 11.5 - 16.0 g/dL    HCT 02.7 25.3 - 66.4 %    MCV 89.6 78.0 - 100.0 fL    MCH 29.2 26.0 - 32.0 pg    MCHC 32.5 31.0 - 35.5 g/dL    RDW-CV 40.3 47.4 - 25.9 %    PLATELETS 222 150 - 400 x10^3/uL    MPV 12.2 8.7 - 12.5 fL    NEUTROPHIL % 69.0 %    LYMPHOCYTE % 19.0 %    MONOCYTE % 8.0 %    EOSINOPHIL % 3.0 %    BASOPHIL % 1.0 %    NEUTROPHIL # 6.35 1.50 - 7.70 x10^3/uL    LYMPHOCYTE # 1.79 1.00 - 4.80 x10^3/uL    MONOCYTE # 0.78 0.20 - 1.10 x10^3/uL    EOSINOPHIL # 0.28 <=0.50 x10^3/uL    BASOPHIL # <0.10 <=0.20 x10^3/uL    IMMATURE GRANULOCYTE % 0.0 0.0 - 1.0 %    IMMATURE GRANULOCYTE # <0.10 <0.10 x10^3/uL    URINALYSIS, MACRO/MICRO   Result Value Ref Range    COLOR Yellow Yellow    APPEARANCE Clear Clear    SPECIFIC GRAVITY 1.008 1.001 - 1.030    PH 5.5 5.0 - 9.0    PROTEIN Negative Negative  mg/dL    GLUCOSE Negative Negative mg/dL    KETONES Negative Negative mg/dL    UROBILINOGEN 0.2 0.2 - 1.0 mg/dL    BILIRUBIN Negative Negative mg/dL    BLOOD Negative Negative mg/dL    NITRITE Negative Negative    LEUKOCYTES Negative Negative WBCs/uL        Radiographical Imaging:   Results for orders placed or performed during the hospital encounter of 10/30/22   XR AP MOBILE CHEST     Status: None    Narrative    INDICATION: Shortness of Breath.    TECHNIQUE:  Frontal chest was obtained at 18:13 hours.    COMPARISON:  [ SELECT TYPE OF COMPARISON ]    FINDINGS:    The cardiomediastinal silhouette is normal in size.  There is no consolidation or atelectasis in either lung.  There are no pleural effusions.  There is no pneumothorax.  No acute osseous process.        Impression    No radiographic evidence of acute cardiopulmonary disease.      Signed by Alfonse Flavors, MD   CT ABDOMEN PELVIS WO IV CONTRAST     Status: None    Narrative    INDICATION:  Epigastric pain.    TECHNIQUE:  CT of the abdomen and pelvis was performed without intravenous contrast.      Automated mA/kV exposure control was utilized and patient examination was performed in strict accordance with principles of ALARA.    RADIATION AMOUNT:  439.3 mGy-cm.    COMPARISON:  None.    FINDINGS:   Abdomen:  The lung bases are clear.  The liver, spleen, pancreas, adrenal glands, and kidneys are suboptimally evaluated on these unenhanced images, but demonstrate no acute pathology. Partially contracted gallbladder with biliary sludge suspected, but without cholelithiasis or wall thickening. No definite epigastric abnormality.    There is no free air or lymph node enlargement.  Abdominal aorta is not aneurysmal.    Pelvis:  There is no bowel wall thickening or obstruction.   There is no free fluid.  Lymph nodes are not enlarged.  Urinary bladder is overdistended but otherwise unremarkable. Mild umbilical diastases.    Skeleton:    There are no acute fractures.  No suspicious bony lesions.      Impression    Partially contracted gallbladder with likely biliary sludge; no cholelithiasis.    Sonographic correlation suggested.    No biliary dilatation.    No mesenteritis or pelvic inflammatory changes..      Signed by Monna Fam, MD       EKG-   Most Recent EKG This Encounter   ECG 12 LEAD    Collection Time: 10/30/22  5:57 PM   Result Value    Ventricular rate 64    Atrial Rate 64    PR Interval 212    QRS Duration 92    QT Interval 400    QTC Calculation 412    Calculated P Axis 60    Calculated R Axis 12    Calculated T Axis 52    Narrative    Sinus rhythm with 1st degree AV block  Otherwise normal ECG  When compared with ECG of 18-Apr-2022 08:14,  PR interval has increased  Incomplete right bundle branch block is no longer present  Confirmed by Ninfa Linden, Chalisa Kobler ([id number]) on 10/30/2022 6:11:56 PM         All labs were reviewed. Medical Records reviewed.     MDM/Course:  Medical Decision Making  Amount and/or Complexity of Data Reviewed  Labs: ordered.  Radiology: ordered.  ECG/medicine tests: ordered.    Risk  OTC drugs.  Prescription drug management.         Visit Vitals  Vitals:    10/30/22 1734   BP: 132/78   Pulse: 65   Resp: 18   Temp: 36.5 C (97.7 F)   SpO2: 99%   Weight: 85.3 kg (188 lb)   Height: 1.753 m (5\' 9" )   BMI: 27.82              Disposition: Discharged    Clinical Impression:   Clinical Impression   Gastroesophageal reflux disease (Primary)     Follow Up: Omelia Blackwater, MD  [address]  [address]  [place] [address]  [phone]    In 1 week      Macie Burows, [place]  [address]  [place] [address]  [phone]          Medications Prescribed:   New Prescriptions    ONDANSETRON (ZOFRAN ODT) 4 MG ORAL TABLET, RAPID DISSOLVE    Take 1 Tablet (4 mg  total) by mouth Every 8 hours as needed for Nausea/Vomiting    PANTOPRAZOLE (PROTONIX) 40 MG ORAL TABLET, DELAYED RELEASE (E.C.)    Take 1 Tablet (40 mg total) by mouth Once a day for 90 days   Plan and treatment.  Patient has heartburn as well as burping and indigestion.  Based on the history is seems like he she has peptic ulcer disease/gastritis/gastroesophageal reflux disease.  She was given 4 mg of Zofran and the 20 cc of Mylanta that helped her symptoms.  I discussed results with patient and she was discharged home on Protonix 40 mg twice for week and then once a day.  She was also put on Zofran p.r.n. and she was also referred to [facility] for EGD.  The follow-up your doctor as needed.  Return if symptoms get worse.      Emeline Gins, MD 10/30/2022, 34:74

## 2022-10-30 NOTE — ED Nurses Note (Signed)
Patient discharged home with family.  AVS reviewed with patient/care giver.  A written copy of the AVS and discharge instructions was given to the patient/care giver. Scripts handed to patient/care giver. Questions sufficiently answered as needed.  Patient/care giver encouraged to follow up with PCP as indicated.  In the event of an emergency, patient/care giver instructed to call 911 or go to the nearest emergency room.

## 2022-10-30 NOTE — ED Triage Notes (Signed)
Pains under left breast in the ribs for 3 days, coughing

## 2022-11-18 ENCOUNTER — Other Ambulatory Visit (HOSPITAL_COMMUNITY): Payer: Self-pay | Admitting: GYNECOLOGICAL/ONCOLOGY

## 2022-11-18 DIAGNOSIS — R1909 Other intra-abdominal and pelvic swelling, mass and lump: Secondary | ICD-10-CM

## 2022-11-18 DIAGNOSIS — C519 Malignant neoplasm of vulva, unspecified: Secondary | ICD-10-CM

## 2022-12-22 ENCOUNTER — Other Ambulatory Visit: Payer: Self-pay

## 2022-12-22 ENCOUNTER — Other Ambulatory Visit: Payer: BC Managed Care – PPO | Attending: GYNECOLOGICAL/ONCOLOGY

## 2022-12-22 DIAGNOSIS — Z01812 Encounter for preprocedural laboratory examination: Secondary | ICD-10-CM | POA: Insufficient documentation

## 2022-12-22 LAB — BUN: BUN: 22 mg/dL (ref 8–25)

## 2022-12-22 LAB — CREATININE WITH EGFR
CREATININE: 0.91 mg/dL (ref 0.60–1.05)
ESTIMATED GFR - FEMALE: 73 mL/min/BSA (ref >=60)

## 2022-12-27 ENCOUNTER — Other Ambulatory Visit: Payer: Self-pay

## 2022-12-27 ENCOUNTER — Inpatient Hospital Stay
Admission: RE | Admit: 2022-12-27 | Discharge: 2022-12-27 | Disposition: A | Discharge status: Home / Self Care | Payer: BC Managed Care – PPO | Source: Ambulatory Visit | Attending: GYNECOLOGICAL/ONCOLOGY | Admitting: GYNECOLOGICAL/ONCOLOGY

## 2022-12-27 DIAGNOSIS — R1909 Other intra-abdominal and pelvic swelling, mass and lump: Secondary | ICD-10-CM | POA: Insufficient documentation

## 2022-12-27 DIAGNOSIS — C519 Malignant neoplasm of vulva, unspecified: Secondary | ICD-10-CM | POA: Insufficient documentation

## 2022-12-27 MED ORDER — IOPAMIDOL 300 MG IODINE/ML (61 %) INTRAVENOUS SOLUTION
100.0000 mL | INTRAVENOUS | Status: AC
Start: 2022-12-27 — End: 2022-12-27
  Administered 2022-12-27: 100 mL via INTRAVENOUS

## 2022-12-27 MED ORDER — BARIUM SULFATE 2 % (W/V) ORAL SUSPENSION
450.0000 mL | ORAL | Status: AC
Start: 2022-12-27 — End: 2022-12-27
  Administered 2022-12-27: 450 mL via ORAL

## 2023-01-03 ENCOUNTER — Other Ambulatory Visit (HOSPITAL_COMMUNITY): Payer: Self-pay | Admitting: GYNECOLOGICAL/ONCOLOGY

## 2023-01-03 ENCOUNTER — Encounter (HOSPITAL_COMMUNITY): Payer: Self-pay

## 2023-01-03 DIAGNOSIS — D259 Leiomyoma of uterus, unspecified: Secondary | ICD-10-CM

## 2023-01-05 ENCOUNTER — Other Ambulatory Visit: Payer: Self-pay

## 2023-01-05 ENCOUNTER — Encounter (HOSPITAL_BASED_OUTPATIENT_CLINIC_OR_DEPARTMENT_OTHER): Payer: Self-pay | Admitting: Obstetrics & Gynecology

## 2023-01-05 ENCOUNTER — Ambulatory Visit: Payer: BC Managed Care – PPO | Attending: Obstetrics & Gynecology | Admitting: Obstetrics & Gynecology

## 2023-01-05 VITALS — BP 175/90 | HR 67 | Temp 97.7°F | Resp 16 | Ht 69.0 in | Wt 188.5 lb

## 2023-01-05 DIAGNOSIS — Z9079 Acquired absence of other genital organ(s): Secondary | ICD-10-CM | POA: Insufficient documentation

## 2023-01-05 DIAGNOSIS — N904 Leukoplakia of vulva: Secondary | ICD-10-CM | POA: Insufficient documentation

## 2023-01-05 DIAGNOSIS — L900 Lichen sclerosus et atrophicus: Secondary | ICD-10-CM

## 2023-01-05 DIAGNOSIS — C519 Malignant neoplasm of vulva, unspecified: Secondary | ICD-10-CM

## 2023-01-05 DIAGNOSIS — Z8544 Personal history of malignant neoplasm of other female genital organs: Secondary | ICD-10-CM | POA: Insufficient documentation

## 2023-01-05 NOTE — Nursing Note (Signed)
New Patient Visit - [facility]      Diagnosis: here for second opinion; lichen sclerosus of vulva     Patient assessed for barriers to learning, language, learning preference, and teaching needs.      [facility] telephone triage brochure and magnet provided with verbal instructions on how to access this support should the patient develop symptoms, side effects and when necessary go to a local emergency room.    Verbally reviewed that appointments may be rescheduled but missed appointments will be followed up with a phone call or letter from our clinic. Provided a written copy of [facility]'s and  [facility] appointment policies.    Oriented to the [facility]. Verbally reviewed support services offered at the [facility]: home medical supply service, chaplain, psychosocial/supportive care clinic, registered dietician, social workers, pharmacy specialists, financial counselors.        Caregiver Education  Patient has a Caregiver: Yes     Patient Caregiver: husband, Museum/gallery curator phone number:[phone]    Patient gives verbal permission to the [employer] clinic staff to discuss medical information with the caregiver: Yes. Patient also gives permission to discuss information with her daughter-in-law, Rashaun Cassone.     Caregiver not with her today. Patient understands treatment options and will discuss with husband and call back to make an appointment if needed.     Jasmine Awe, RN

## 2023-01-05 NOTE — Progress Notes (Signed)
DEPARTMENT OF GYNECOLOGIC ONCOLOGY  [facility]  [facility] MEDICINE    [facility]  [place], [place] [address]  O: [phone]  F: [phone]    NEW PATIENT VISIT    PATIENT NAME:  Suzanne Olson   DATE OF BIRTH:  1965/12/16  MRN:  [id number]   DATE OF SERVICE:  01/05/2023    DIAGNOSIS:  1b scc of the vulva  TREATMENTS:  right simple vulvectomy 10/2019, lichen sclerosis  right partial radical vulvectomy with bilateral sentinel lymph node dissection 12/17/2019  Bilateral simple vulvectomy 07/26/2022- dVIN    SUBJECTIVE:  History of Present Illness:   Suzanne Olson is a 57 y.o. White female presenting for second opinion for lichen sclerosis.  She has a long history of lichen sclerosis treated with clobetasol and premarin- she has had resections due to dVIN and cancer (see above).  States the clobetasol does not really help- she is using lidocaine with some relief.  Most painful area is periclitoral but also has some pain at the base of the introitus.     Review of Systems:   Other than ROS in the HPI, all other systems were negative.    MEDICAL HISTORY:  Obstetric History, Past Medical History, Past Surgical History, Family History, Social History, Medications, and Allergies were reviewed with the patient and updated below, as appropriate:    Obstetric History:  OB History       Gravida   2    Para   2    Term   2    Preterm        AB        Living             SAB        IAB        Ectopic        Multiple        Live Births                   Past Medical History:  Past Medical History:   Diagnosis Date    Arthropathy     Cancer (CMS HCC)     HTN (hypertension)     Thyroid disease      Past Surgical History:  Past Surgical History:   Procedure Laterality Date    Hx breast biopsy Left      Family History:  Family Medical History:       Problem Relation (Age of Onset)    Atrial fibrillation Mother    Breast Cancer Paternal Aunt, Paternal Aunt, Paternal Aunt,  Paternal Aunt, Paternal cousin          Social History:  Social History     Occupational History    Not on file   Tobacco Use    Smoking status: Former     Types: Cigarettes    Smokeless tobacco: Never   Vaping Use    Vaping status: Never Used   Substance and Sexual Activity    Alcohol use: Not Currently    Drug use: Not Currently    Sexual activity: Not on file     Medications:  Current Outpatient Medications   Medication Sig    atenoloL (TENORMIN) 100 mg Oral Tablet Take 1 Tablet (100 mg total) by mouth Once a day    busPIRone (BUSPAR) 7.5 mg Oral Tablet Take 1 Tablet (7.5 mg total) by mouth Twice daily  levothyroxine (SYNTHROID) 75 mcg Oral Tablet 1 Tablet (75 mcg total) Once a day    meloxicam (MOBIC) 15 mg Oral Tablet Take 1 Tablet (15 mg total) by mouth Once a day    olmesartan (BENICAR) 40 mg Oral Tablet Take 1 Tablet (40 mg total) by mouth Once a day    ondansetron (ZOFRAN ODT) 4 mg Oral Tablet, Rapid Dissolve Take 1 Tablet (4 mg total) by mouth Every 8 hours as needed for Nausea/Vomiting    pantoprazole (PROTONIX) 40 mg Oral Tablet, Delayed Release (E.C.) Take 1 Tablet (40 mg total) by mouth Once a day for 90 days     Allergies:  No Known Allergies  OBJECTIVE:  BP (!) 175/90   Pulse 67   Temp 36.5 C (97.7 F)   Resp 16   Ht 1.753 m (5\' 9" )   Wt 85.5 kg (188 lb 7.9 oz)   SpO2 97%   BMI 27.84 kg/m     General Appearance:  alert, cooperative, no distress, appears stated age  Head:  Normocephalic, without obvious abnormality, atraumatic  Eyes:  conjunctivae/corneas clear. PERRL, EOM's intact. Fundi benign  Neck:  supple, symmetrical, trachea midline and no adenopathy  Pulmonary/chest:  clear to auscultation bilaterally  Heart:  regular rate and rhythm  GI:  soft, non-tender. No masses,  no organomegaly  Pelvic:  atrophy of the external female genitalia, hyperkeritosis of the periclitoral and at 6 oclock- see media  Extremities:  extremities normal, atraumatic, no cyanosis or edema  Skin:  Skin color,  texture, turgor normal. No rashes or lesions  Lymph Nodes:  Cervical, supraclavicular,nodes normal. Mild swelling in left inguinal region consistent with known lymphocele  Neurologic:  Grossly normal    Labs:              No results found for this or any previous visit (from the past 24 hour(s)).  All labs obtained were personally reviewed by myself.      Radiographic Imaging:    INDICATION:  Intraabdominal and pelvic swelling, malignant neoplasm of vulva.     TECHNIQUE:  CT of the chest, abdomen, and pelvis was performed with intravenous contrast.  Isovue 300 100 mL was administered intravenously.       Automated mA/kV exposure control was utilized and patient examination was performed in strict accordance with principles of ALARA.     RADIATION AMOUNT:  684.40 mGy-cm.     COMPARISON:  CT AP 10/30/2022; XR chest 10/30/2022, 04/28/2022; CTA chest 04/18/2022; CT chest 07/10/2018.     FINDINGS:   No acute opacities or effusions are visualized.  There is no evidence of a pneumothorax.  There is a 3 mm nodule in the lateral aspect of the lingula(Series 4, Image 35).  No significant change is seen compared to the prior study.  No new nodules are appreciated.  No filling defects are seen within the trachea or mainstem bronchi.  There are slightly enlarged hilar and mediastinal lymph nodes.  There is a right hilar lymph node measured 1.0 x 1.0 cm.  There is a left hilar lymph node measured at 1.3 x 1.2 cm.  There is a subcarinal lymph node estimated at 1.9 x 1.5 cm.  No significant change is seen compared to the prior study.  No pericardial effusion is noted.  No coronary artery calcifications are appreciated.  No filling defects are seen in the central pulmonary arteries.  There is no evidence of aneurysmal dilatation of the ascending aorta, aortic arch, or descending thoracic  aorta.  Degenerative changes are seen within the thoracic spine.     No acute findings are seen within the liver.  The portal and hepatic veins are  patent.  There is no intrahepatic ductal dilatation.  The gallbladder is mildly distended.  No acute findings are seen within the spleen, pancreas, or adrenal glands.  No acute findings are seen within either kidney.  No dilated loops of small bowel or colon are visualized.  There is no evidence for free intraperitoneal air.  The appendix is not visualized.  No inflammatory changes are seen adjacent to the cecum.  No enlarged lymph nodes are seen within the pelvic sidewalls, iliac chains, or retroperitoneum.  There is no evidence of aneurysmal dilatation of the abdominal aorta.  No prominent edema is seen within the psoas musculature.  There is a small fluid structure in the left inguinal region measured at 2.9 x 3.0 cm.  This may be indicative of a small seroma or lymphocele.  There is an enhancing lesion in the posterior myometrium, measured at 1.5 x 1.7 cm(Series 6, Image 75).  This may indicate a small fibroid.  Correlation with pelvic sonogram is recommended.     IMPRESSION:     CHEST:  1.  Mild mediastinal and hilar adenopathy, unchanged from prior study.  2.  Stable 3 mm nodule in the lingula.     ABDOMEN/PELVIS:  1.  No prominent adenopathy noted.  2.  Fluid-filled structure in the left inguinal region, which is nonspecific.  This may indicate a seroma or lymphocele.  3.  Enhancing lesion in the posterior myometrium, which may indicate a fibroid.  Follow-up with pelvic sonogram is recommended.     All radiographic imaging obtained was personally reviewed by myself.      ASSESSMENT/PLAN:  JONYA INGLETT is a White 57 y.o. female with:  Lichen sclerosis refractory to medical management- discussed laser ablation of the lesions for symptom relief.  Advised that I would recommend vulvoscopy with biopsies as indicated in the OR prior to ablation.  If ablation fails, would then recommend complete vulvar resection with plastic reconstruction.  Due to the initial discomfort with healing, I do not recommend doing more  that 1/4th of the vulva at a time. She will discuss with her husband.  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          //Anicka Stuckert, MD  01/05/2023 10:58 AM

## 2023-01-12 ENCOUNTER — Other Ambulatory Visit (HOSPITAL_BASED_OUTPATIENT_CLINIC_OR_DEPARTMENT_OTHER): Payer: Self-pay | Admitting: Obstetrics & Gynecology

## 2023-01-12 DIAGNOSIS — C519 Malignant neoplasm of vulva, unspecified: Secondary | ICD-10-CM

## 2023-01-14 ENCOUNTER — Encounter (HOSPITAL_COMMUNITY): Payer: Self-pay

## 2023-01-17 ENCOUNTER — Encounter (HOSPITAL_COMMUNITY): Payer: BC Managed Care – PPO

## 2023-01-18 ENCOUNTER — Other Ambulatory Visit: Payer: BC Managed Care – PPO | Attending: GYNECOLOGICAL/ONCOLOGY

## 2023-01-18 ENCOUNTER — Other Ambulatory Visit: Payer: Self-pay

## 2023-01-18 DIAGNOSIS — Z01812 Encounter for preprocedural laboratory examination: Secondary | ICD-10-CM | POA: Insufficient documentation

## 2023-01-18 LAB — CBC WITH DIFF
BASOPHIL #: <0.1 10*3/uL (ref <=0.20)
BASOPHIL %: 1 %
EOSINOPHIL #: 0.18 10*3/uL (ref <=0.50)
EOSINOPHIL %: 3 %
HCT: 41.5 % (ref 34.8–46.0)
HGB: 13.2 g/dL (ref 11.5–16.0)
IMMATURE GRANULOCYTE #: <0.1 10*3/uL (ref <0.10)
IMMATURE GRANULOCYTE %: 0 % (ref 0.0–1.0)
LYMPHOCYTE #: 1.43 10*3/uL (ref 1.00–4.80)
LYMPHOCYTE %: 23 %
MCH: 28.9 pg (ref 26.0–32.0)
MCHC: 31.8 g/dL (ref 31.0–35.5)
MCV: 90.8 fL (ref 78.0–100.0)
MONOCYTE #: 0.68 10*3/uL (ref 0.20–1.10)
MONOCYTE %: 11 %
MPV: 12.6 fL — ABNORMAL HIGH (ref 8.7–12.5)
NEUTROPHIL #: 3.77 10*3/uL (ref 1.50–7.70)
NEUTROPHIL %: 62 %
PLATELETS: 216 10*3/uL (ref 150–400)
RBC: 4.57 10*6/uL (ref 3.85–5.22)
RDW-CV: 12.8 % (ref 11.5–15.5)
WBC: 6.1 10*3/uL (ref 3.7–11.0)

## 2023-01-18 LAB — COMPREHENSIVE METABOLIC PNL, FASTING
ALBUMIN: 3.8 g/dL (ref 3.5–5.0)
ALKALINE PHOSPHATASE: 70 U/L (ref 50–130)
ALT (SGPT): 9 U/L (ref 8–22)
ANION GAP: 8 mmol/L (ref 4–13)
AST (SGOT): 18 U/L (ref 8–45)
BILIRUBIN TOTAL: 0.3 mg/dL (ref 0.3–1.3)
BUN/CREA RATIO: 21 (ref 6–22)
BUN: 24 mg/dL (ref 8–25)
CALCIUM: 9.8 mg/dL (ref 8.6–10.2)
CHLORIDE: 108 mmol/L (ref 96–111)
CO2 TOTAL: 26 mmol/L (ref 22–30)
CREATININE: 1.16 mg/dL — ABNORMAL HIGH (ref 0.60–1.05)
ESTIMATED GFR - FEMALE: 54 mL/min/BSA — ABNORMAL LOW (ref >=60)
GLUCOSE: 107 mg/dL — ABNORMAL HIGH (ref 70–99)
POTASSIUM: 4 mmol/L (ref 3.5–5.1)
PROTEIN TOTAL: 7.9 g/dL (ref 6.4–8.3)
SODIUM: 142 mmol/L (ref 136–145)

## 2023-02-08 ENCOUNTER — Ambulatory Visit (HOSPITAL_BASED_OUTPATIENT_CLINIC_OR_DEPARTMENT_OTHER): Payer: Self-pay | Admitting: Obstetrics & Gynecology

## 2023-02-08 ENCOUNTER — Other Ambulatory Visit (INDEPENDENT_AMBULATORY_CARE_PROVIDER_SITE_OTHER): Payer: BC Managed Care – PPO

## 2023-02-09 ENCOUNTER — Ambulatory Visit (HOSPITAL_BASED_OUTPATIENT_CLINIC_OR_DEPARTMENT_OTHER): Payer: Self-pay

## 2023-02-09 ENCOUNTER — Other Ambulatory Visit (INDEPENDENT_AMBULATORY_CARE_PROVIDER_SITE_OTHER): Payer: Self-pay | Admitting: Family

## 2023-02-11 DIAGNOSIS — N3000 Acute cystitis without hematuria: Secondary | ICD-10-CM | POA: Diagnosis not present

## 2023-02-11 DIAGNOSIS — N3090 Cystitis, unspecified without hematuria: Secondary | ICD-10-CM | POA: Diagnosis not present

## 2023-02-14 ENCOUNTER — Encounter (HOSPITAL_COMMUNITY): Payer: Self-pay

## 2023-02-14 ENCOUNTER — Other Ambulatory Visit (INDEPENDENT_AMBULATORY_CARE_PROVIDER_SITE_OTHER): Payer: Self-pay | Admitting: Family

## 2023-02-14 ENCOUNTER — Inpatient Hospital Stay (HOSPITAL_COMMUNITY): Admit: 2023-02-14 | Payer: BC Managed Care – PPO | Admitting: Obstetrics & Gynecology

## 2023-02-14 SURGERY — VULVOSCOPY
Anesthesia: General

## 2023-02-22 ENCOUNTER — Ambulatory Visit (HOSPITAL_BASED_OUTPATIENT_CLINIC_OR_DEPARTMENT_OTHER): Payer: Self-pay | Admitting: Obstetrics & Gynecology

## 2023-03-22 DIAGNOSIS — Z23 Encounter for immunization: Secondary | ICD-10-CM | POA: Diagnosis not present

## 2023-06-07 DIAGNOSIS — I519 Heart disease, unspecified: Secondary | ICD-10-CM | POA: Diagnosis not present

## 2023-06-07 DIAGNOSIS — I4719 Other supraventricular tachycardia: Secondary | ICD-10-CM | POA: Diagnosis not present

## 2023-06-07 DIAGNOSIS — Q210 Ventricular septal defect: Secondary | ICD-10-CM | POA: Diagnosis not present

## 2023-06-07 DIAGNOSIS — I341 Nonrheumatic mitral (valve) prolapse: Secondary | ICD-10-CM | POA: Diagnosis not present

## 2023-06-07 DIAGNOSIS — I83893 Varicose veins of bilateral lower extremities with other complications: Secondary | ICD-10-CM | POA: Diagnosis not present

## 2023-06-30 DIAGNOSIS — R059 Cough, unspecified: Secondary | ICD-10-CM | POA: Diagnosis not present

## 2023-06-30 DIAGNOSIS — J040 Acute laryngitis: Secondary | ICD-10-CM | POA: Diagnosis not present

## 2023-06-30 DIAGNOSIS — R0981 Nasal congestion: Secondary | ICD-10-CM | POA: Diagnosis not present

## 2023-07-08 DIAGNOSIS — R0981 Nasal congestion: Secondary | ICD-10-CM | POA: Diagnosis not present

## 2023-07-08 DIAGNOSIS — J069 Acute upper respiratory infection, unspecified: Secondary | ICD-10-CM | POA: Diagnosis not present

## 2023-08-10 DIAGNOSIS — Z01419 Encounter for gynecological examination (general) (routine) without abnormal findings: Secondary | ICD-10-CM | POA: Diagnosis not present

## 2023-08-10 DIAGNOSIS — Z1231 Encounter for screening mammogram for malignant neoplasm of breast: Secondary | ICD-10-CM | POA: Diagnosis not present

## 2023-08-12 ENCOUNTER — Ambulatory Visit
Admission: RE | Admit: 2023-08-12 | Discharge: 2023-08-12 | Disposition: A | Discharge status: Home / Self Care | Payer: 59 | Source: Ambulatory Visit | Attending: GYNECOLOGICAL/ONCOLOGY | Admitting: GYNECOLOGICAL/ONCOLOGY

## 2023-08-12 ENCOUNTER — Other Ambulatory Visit: Payer: Self-pay

## 2023-08-12 ENCOUNTER — Ambulatory Visit (HOSPITAL_BASED_OUTPATIENT_CLINIC_OR_DEPARTMENT_OTHER): Payer: 59

## 2023-08-12 ENCOUNTER — Other Ambulatory Visit (HOSPITAL_COMMUNITY): Payer: Self-pay | Admitting: GYNECOLOGICAL/ONCOLOGY

## 2023-08-12 DIAGNOSIS — R001 Bradycardia, unspecified: Secondary | ICD-10-CM | POA: Insufficient documentation

## 2023-08-12 DIAGNOSIS — N904 Leukoplakia of vulva: Secondary | ICD-10-CM | POA: Insufficient documentation

## 2023-08-12 DIAGNOSIS — R9431 Abnormal electrocardiogram [ECG] [EKG]: Secondary | ICD-10-CM | POA: Insufficient documentation

## 2023-08-12 DIAGNOSIS — Z01812 Encounter for preprocedural laboratory examination: Secondary | ICD-10-CM

## 2023-08-12 DIAGNOSIS — I4510 Unspecified right bundle-branch block: Secondary | ICD-10-CM | POA: Insufficient documentation

## 2023-08-12 DIAGNOSIS — I4519 Other right bundle-branch block: Secondary | ICD-10-CM

## 2023-08-12 LAB — CBC WITH DIFF
BASOPHIL #: <0.1 10*3/uL (ref <=0.20)
BASOPHIL %: 1 %
EOSINOPHIL #: 0.14 10*3/uL (ref <=0.50)
EOSINOPHIL %: 2.4 %
HCT: 40.8 % (ref 34.8–46.0)
HGB: 12.9 g/dL (ref 11.5–16.0)
IMMATURE GRANULOCYTE #: <0.1 10*3/uL (ref <0.10)
IMMATURE GRANULOCYTE %: 0.2 % (ref 0.0–1.0)
LYMPHOCYTE #: 1.43 10*3/uL (ref 1.00–4.80)
LYMPHOCYTE %: 24.2 %
MCH: 28.5 pg (ref 26.0–32.0)
MCHC: 31.6 g/dL (ref 31.0–35.5)
MCV: 90.3 fL (ref 78.0–100.0)
MONOCYTE #: 0.55 10*3/uL (ref 0.20–1.10)
MONOCYTE %: 9.3 %
MPV: 12.1 fL (ref 8.7–12.5)
NEUTROPHIL #: 3.71 10*3/uL (ref 1.50–7.70)
NEUTROPHIL %: 62.9 %
PLATELETS: 212 10*3/uL (ref 150–400)
RBC: 4.52 10*6/uL (ref 3.85–5.22)
RDW-CV: 13.6 % (ref 11.5–15.5)
WBC: 5.9 10*3/uL (ref 3.7–11.0)

## 2023-08-12 LAB — ECG 12 LEAD
Atrial Rate: 56 {beats}/min
Calculated P Axis: 48 degrees
Calculated R Axis: 67 degrees
Calculated T Axis: 80 degrees
PR Interval: 188 ms
QRS Duration: 96 ms
QT Interval: 434 ms
QTC Calculation: 418 ms
Ventricular rate: 56 {beats}/min

## 2023-08-12 LAB — COMPREHENSIVE METABOLIC PNL, FASTING
ALBUMIN: 3.9 g/dL (ref 3.4–4.8)
ALKALINE PHOSPHATASE: 86 U/L (ref 50–130)
ALT (SGPT): 15 U/L (ref <31)
ANION GAP: 10 mmol/L (ref 4–13)
AST (SGOT): 21 U/L (ref 11–34)
BILIRUBIN TOTAL: 0.6 mg/dL (ref 0.3–1.3)
BUN/CREA RATIO: 20 (ref 6–22)
BUN: 20 mg/dL (ref 8–25)
CALCIUM: 9.4 mg/dL (ref 8.6–10.3)
CHLORIDE: 105 mmol/L (ref 96–111)
CO2 TOTAL: 26 mmol/L (ref 23–31)
CREATININE: 1.02 mg/dL (ref 0.60–1.05)
ESTIMATED GFR - FEMALE: 63 mL/min/BSA (ref >=60)
GLUCOSE: 84 mg/dL (ref 70–99)
POTASSIUM: 4.7 mmol/L (ref 3.5–5.1)
PROTEIN TOTAL: 7.4 g/dL (ref 5.6–7.6)
SODIUM: 141 mmol/L (ref 136–145)

## 2024-01-02 ENCOUNTER — Other Ambulatory Visit (HOSPITAL_COMMUNITY): Payer: Self-pay | Admitting: GYNECOLOGICAL/ONCOLOGY

## 2024-01-02 DIAGNOSIS — Z1239 Encounter for other screening for malignant neoplasm of breast: Secondary | ICD-10-CM

## 2024-01-06 ENCOUNTER — Other Ambulatory Visit: Payer: Self-pay

## 2024-01-06 ENCOUNTER — Encounter (HOSPITAL_BASED_OUTPATIENT_CLINIC_OR_DEPARTMENT_OTHER): Payer: Self-pay

## 2024-01-06 ENCOUNTER — Ambulatory Visit
Admission: RE | Admit: 2024-01-06 | Discharge: 2024-01-06 | Disposition: A | Discharge status: Home / Self Care | Payer: Self-pay | Source: Ambulatory Visit | Attending: GYNECOLOGICAL/ONCOLOGY | Admitting: GYNECOLOGICAL/ONCOLOGY

## 2024-01-06 DIAGNOSIS — Z1231 Encounter for screening mammogram for malignant neoplasm of breast: Secondary | ICD-10-CM | POA: Insufficient documentation

## 2024-01-06 DIAGNOSIS — Z1239 Encounter for other screening for malignant neoplasm of breast: Secondary | ICD-10-CM

## 2024-01-19 DIAGNOSIS — Z79899 Other long term (current) drug therapy: Secondary | ICD-10-CM | POA: Diagnosis not present

## 2024-01-19 DIAGNOSIS — E785 Hyperlipidemia, unspecified: Secondary | ICD-10-CM | POA: Diagnosis not present

## 2024-01-19 DIAGNOSIS — E559 Vitamin D deficiency, unspecified: Secondary | ICD-10-CM | POA: Diagnosis not present

## 2024-01-23 DIAGNOSIS — Z1231 Encounter for screening mammogram for malignant neoplasm of breast: Secondary | ICD-10-CM

## 2024-01-24 DIAGNOSIS — Z8679 Personal history of other diseases of the circulatory system: Secondary | ICD-10-CM | POA: Diagnosis not present

## 2024-01-24 DIAGNOSIS — I8390 Asymptomatic varicose veins of unspecified lower extremity: Secondary | ICD-10-CM | POA: Diagnosis not present

## 2024-01-24 DIAGNOSIS — I10 Essential (primary) hypertension: Secondary | ICD-10-CM | POA: Diagnosis not present

## 2024-01-24 DIAGNOSIS — E785 Hyperlipidemia, unspecified: Secondary | ICD-10-CM | POA: Diagnosis not present

## 2024-04-24 DIAGNOSIS — E785 Hyperlipidemia, unspecified: Secondary | ICD-10-CM | POA: Diagnosis not present

## 2024-05-01 DIAGNOSIS — E785 Hyperlipidemia, unspecified: Secondary | ICD-10-CM | POA: Diagnosis not present

## 2024-05-01 DIAGNOSIS — Z8679 Personal history of other diseases of the circulatory system: Secondary | ICD-10-CM | POA: Diagnosis not present

## 2024-05-01 DIAGNOSIS — I8390 Asymptomatic varicose veins of unspecified lower extremity: Secondary | ICD-10-CM | POA: Diagnosis not present

## 2024-05-01 DIAGNOSIS — I10 Essential (primary) hypertension: Secondary | ICD-10-CM | POA: Diagnosis not present
